# Patient Record
Sex: Male | Born: 1975 | Race: Black or African American | Hispanic: No | Marital: Married | Smoking: Current every day smoker
Health system: Southern US, Community
[De-identification: ages and names within clinical notes are randomized; demographics above are authoritative.]

## PROBLEM LIST (undated history)

## (undated) DIAGNOSIS — I10 Essential (primary) hypertension: Secondary | ICD-10-CM

---

## 2003-09-24 ENCOUNTER — Emergency Department (HOSPITAL_COMMUNITY): Admission: EM | Admit: 2003-09-24 | Discharge: 2003-09-24 | Payer: Self-pay | Admitting: Emergency Medicine

## 2004-04-17 ENCOUNTER — Emergency Department (HOSPITAL_COMMUNITY): Admission: EM | Admit: 2004-04-17 | Discharge: 2004-04-17 | Payer: Self-pay | Admitting: Emergency Medicine

## 2008-08-25 ENCOUNTER — Emergency Department (HOSPITAL_COMMUNITY): Admission: EM | Admit: 2008-08-25 | Discharge: 2008-08-25 | Payer: Self-pay | Admitting: Emergency Medicine

## 2009-09-30 ENCOUNTER — Emergency Department (HOSPITAL_COMMUNITY): Admission: EM | Admit: 2009-09-30 | Discharge: 2009-09-30 | Payer: Self-pay | Admitting: Emergency Medicine

## 2011-07-12 LAB — GLUCOSE, CAPILLARY: Glucose-Capillary: 103 — ABNORMAL HIGH

## 2011-07-17 ENCOUNTER — Emergency Department (HOSPITAL_COMMUNITY): Payer: Self-pay

## 2011-07-17 ENCOUNTER — Emergency Department (HOSPITAL_COMMUNITY)
Admission: EM | Admit: 2011-07-17 | Discharge: 2011-07-17 | Disposition: A | Discharge status: Home / Self Care | Payer: Self-pay | Attending: Emergency Medicine | Admitting: Emergency Medicine

## 2011-07-17 DIAGNOSIS — R079 Chest pain, unspecified: Secondary | ICD-10-CM | POA: Insufficient documentation

## 2011-07-17 DIAGNOSIS — R112 Nausea with vomiting, unspecified: Secondary | ICD-10-CM | POA: Insufficient documentation

## 2011-07-17 DIAGNOSIS — B9789 Other viral agents as the cause of diseases classified elsewhere: Secondary | ICD-10-CM | POA: Insufficient documentation

## 2011-07-17 DIAGNOSIS — K219 Gastro-esophageal reflux disease without esophagitis: Secondary | ICD-10-CM | POA: Insufficient documentation

## 2011-07-17 DIAGNOSIS — R197 Diarrhea, unspecified: Secondary | ICD-10-CM | POA: Insufficient documentation

## 2011-07-17 DIAGNOSIS — R51 Headache: Secondary | ICD-10-CM | POA: Insufficient documentation

## 2011-07-17 LAB — CBC
HCT: 44.2 % (ref 39.0–52.0)
Hemoglobin: 15.4 g/dL (ref 13.0–17.0)
MCH: 29.8 pg (ref 26.0–34.0)
MCHC: 34.8 g/dL (ref 30.0–36.0)
MCV: 85.7 fL (ref 78.0–100.0)
Platelets: 200 10*3/uL (ref 150–400)
RBC: 5.16 MIL/uL (ref 4.22–5.81)
RDW: 15 % (ref 11.5–15.5)
WBC: 11.7 10*3/uL — ABNORMAL HIGH (ref 4.0–10.5)

## 2011-07-17 LAB — DIFFERENTIAL
Basophils Absolute: 0 10*3/uL (ref 0.0–0.1)
Basophils Relative: 0 % (ref 0–1)
Eosinophils Absolute: 0.3 10*3/uL (ref 0.0–0.7)
Eosinophils Relative: 3 % (ref 0–5)
Lymphocytes Relative: 25 % (ref 12–46)
Lymphs Abs: 2.9 10*3/uL (ref 0.7–4.0)
Monocytes Absolute: 1.3 10*3/uL — ABNORMAL HIGH (ref 0.1–1.0)
Monocytes Relative: 11 % (ref 3–12)
Neutro Abs: 7.2 10*3/uL (ref 1.7–7.7)
Neutrophils Relative %: 61 % (ref 43–77)

## 2011-07-17 LAB — POCT I-STAT, CHEM 8
BUN: 20 mg/dL (ref 6–23)
Calcium, Ion: 1.17 mmol/L (ref 1.12–1.32)
Chloride: 105 mEq/L (ref 96–112)
Creatinine, Ser: 1.4 mg/dL — ABNORMAL HIGH (ref 0.50–1.35)
Glucose, Bld: 90 mg/dL (ref 70–99)
HCT: 49 % (ref 39.0–52.0)
Hemoglobin: 16.7 g/dL (ref 13.0–17.0)
Potassium: 3.9 mEq/L (ref 3.5–5.1)
Sodium: 139 mEq/L (ref 135–145)
TCO2: 22 mmol/L (ref 0–100)

## 2011-07-17 LAB — COMPREHENSIVE METABOLIC PANEL
ALT: 27 U/L (ref 0–53)
AST: 26 U/L (ref 0–37)
Albumin: 3.7 g/dL (ref 3.5–5.2)
Alkaline Phosphatase: 68 U/L (ref 39–117)
BUN: 19 mg/dL (ref 6–23)
CO2: 25 mEq/L (ref 19–32)
Calcium: 8.9 mg/dL (ref 8.4–10.5)
Chloride: 103 mEq/L (ref 96–112)
Creatinine, Ser: 1.22 mg/dL (ref 0.50–1.35)
GFR calc Af Amer: 88 mL/min — ABNORMAL LOW (ref >90)
GFR calc non Af Amer: 76 mL/min — ABNORMAL LOW (ref >90)
Glucose, Bld: 94 mg/dL (ref 70–99)
Potassium: 3.9 mEq/L (ref 3.5–5.1)
Sodium: 138 mEq/L (ref 135–145)
Total Bilirubin: 0.2 mg/dL — ABNORMAL LOW (ref 0.3–1.2)
Total Protein: 6.6 g/dL (ref 6.0–8.3)

## 2011-07-17 LAB — LIPASE, BLOOD: Lipase: 33 U/L (ref 11–59)

## 2012-05-10 IMAGING — CR DG CHEST 2V
2 series · 2 of 2 positions shown · non-contrast
Comparison: 09/30/2009

CLINICAL DATA: Cough, congestion.

CHEST - 2 VIEW

[w chest pa]
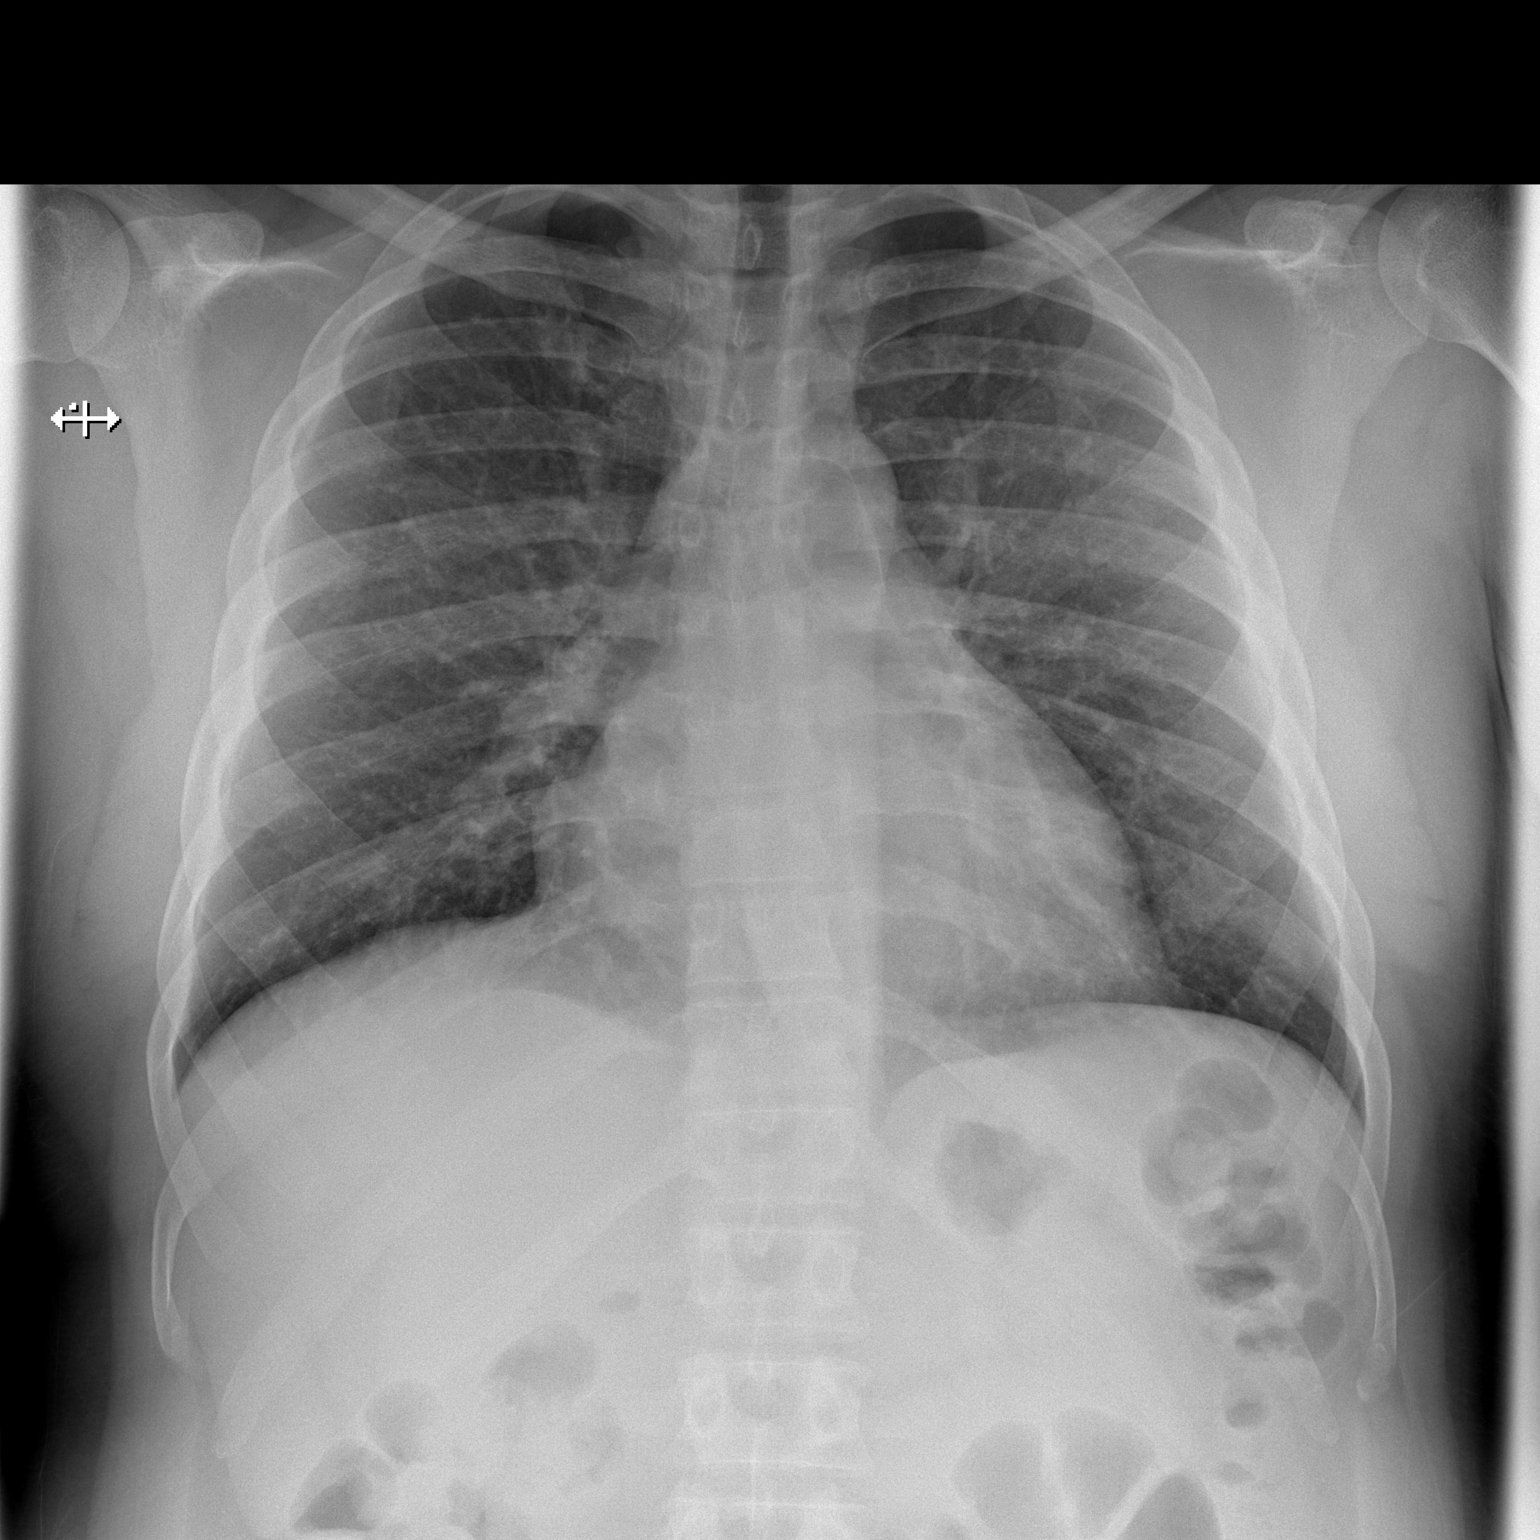

[w chest lat]
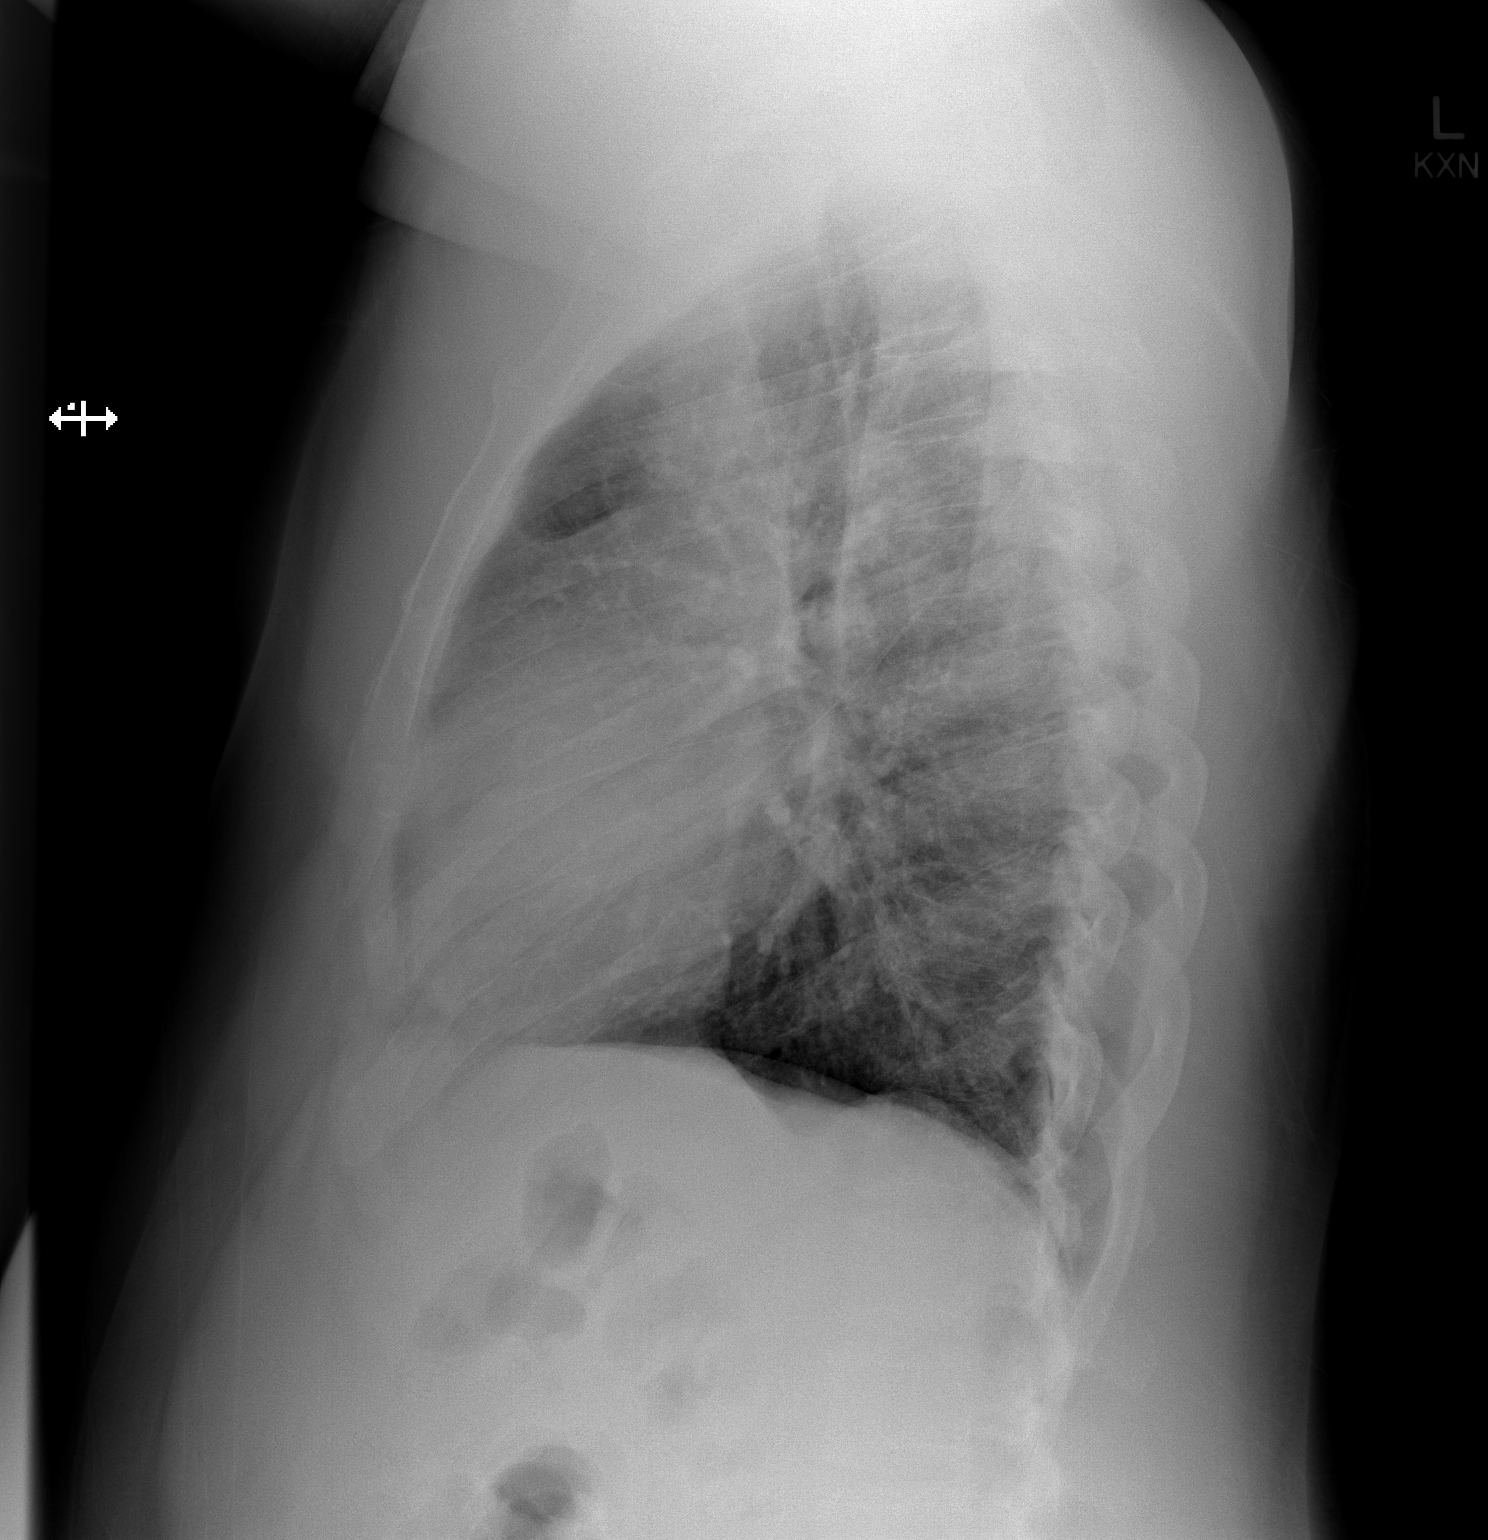

[2 of 2 positions shown; findings below may reference images not displayed]

FINDINGS: Interstitial prominence without focal consolidation.  No
pleural effusion or pneumothorax.  Cardiomediastinal contours are
unchanged, upper normal limits to mildly enlarged.  No acute
osseous abnormality.
IMPRESSION: Mild interstitial prominence without focal consolidation.

## 2012-12-22 ENCOUNTER — Encounter (HOSPITAL_COMMUNITY): Payer: Self-pay | Admitting: Nurse Practitioner

## 2012-12-22 ENCOUNTER — Emergency Department (HOSPITAL_COMMUNITY)
Admission: EM | Admit: 2012-12-22 | Discharge: 2012-12-22 | Disposition: A | Discharge status: Home / Self Care | Payer: Self-pay | Attending: Emergency Medicine | Admitting: Emergency Medicine

## 2012-12-22 DIAGNOSIS — A084 Viral intestinal infection, unspecified: Secondary | ICD-10-CM

## 2012-12-22 DIAGNOSIS — A088 Other specified intestinal infections: Secondary | ICD-10-CM | POA: Insufficient documentation

## 2012-12-22 DIAGNOSIS — R112 Nausea with vomiting, unspecified: Secondary | ICD-10-CM | POA: Insufficient documentation

## 2012-12-22 DIAGNOSIS — R197 Diarrhea, unspecified: Secondary | ICD-10-CM | POA: Insufficient documentation

## 2012-12-22 DIAGNOSIS — F17200 Nicotine dependence, unspecified, uncomplicated: Secondary | ICD-10-CM | POA: Insufficient documentation

## 2012-12-22 DIAGNOSIS — R1084 Generalized abdominal pain: Secondary | ICD-10-CM | POA: Insufficient documentation

## 2012-12-22 LAB — COMPREHENSIVE METABOLIC PANEL
ALT: 27 U/L (ref 0–53)
AST: 26 U/L (ref 0–37)
Albumin: 4.1 g/dL (ref 3.5–5.2)
Alkaline Phosphatase: 82 U/L (ref 39–117)
BUN: 11 mg/dL (ref 6–23)
CO2: 25 mEq/L (ref 19–32)
Calcium: 9.1 mg/dL (ref 8.4–10.5)
Chloride: 103 mEq/L (ref 96–112)
Creatinine, Ser: 1.22 mg/dL (ref 0.50–1.35)
GFR calc Af Amer: 87 mL/min — ABNORMAL LOW (ref >90)
GFR calc non Af Amer: 75 mL/min — ABNORMAL LOW (ref >90)
Glucose, Bld: 93 mg/dL (ref 70–99)
Potassium: 3.7 mEq/L (ref 3.5–5.1)
Sodium: 137 mEq/L (ref 135–145)
Total Bilirubin: 0.2 mg/dL — ABNORMAL LOW (ref 0.3–1.2)
Total Protein: 7.3 g/dL (ref 6.0–8.3)

## 2012-12-22 LAB — URINALYSIS, MICROSCOPIC ONLY
Bilirubin Urine: NEGATIVE
Glucose, UA: NEGATIVE mg/dL
Hgb urine dipstick: NEGATIVE
Ketones, ur: NEGATIVE mg/dL
Leukocytes, UA: NEGATIVE
Nitrite: NEGATIVE
Protein, ur: NEGATIVE mg/dL
Specific Gravity, Urine: 1.027 (ref 1.005–1.030)
Urobilinogen, UA: 1 mg/dL (ref 0.0–1.0)
pH: 5.5 (ref 5.0–8.0)

## 2012-12-22 LAB — LIPASE, BLOOD: Lipase: 25 U/L (ref 11–59)

## 2012-12-22 MED ORDER — SODIUM CHLORIDE 0.9 % IV BOLUS (SEPSIS)
1000.0000 mL | Freq: Once | INTRAVENOUS | Status: AC
  Administered 2012-12-22: 1000 mL via INTRAVENOUS

## 2012-12-22 MED ORDER — ONDANSETRON HCL 4 MG/2ML IJ SOLN
4.0000 mg | Freq: Once | INTRAMUSCULAR | Status: AC
  Administered 2012-12-22: 4 mg via INTRAVENOUS
  Filled 2012-12-22: qty 2

## 2012-12-22 MED ORDER — ONDANSETRON HCL 4 MG PO TABS: 4.0000 mg | ORAL_TABLET | Freq: Four times a day (QID) | ORAL | Status: DC

## 2012-12-22 MED ORDER — MORPHINE SULFATE 4 MG/ML IJ SOLN
2.0000 mg | Freq: Once | INTRAMUSCULAR | Status: AC
  Administered 2012-12-22: 2 mg via INTRAVENOUS
  Filled 2012-12-22: qty 1

## 2012-12-22 NOTE — ED Provider Notes (Signed)
History     CSN: [id number]  Arrival date & time 12/22/12  1116   First MD Initiated Contact with Patient 12/22/12 1214      Chief Complaint  Patient presents with  . Diarrhea    (Consider location/radiation/quality/duration/timing/severity/associated sxs/prior treatment) HPI Comments: Patient presents for nausea vomiting and diarrhea since yesterday. Patient states he has had 5 episodes of nonbloody nonbilious emesis since yesterday and approximately 6 episodes of nonbloody, watery diarrhea since yesterday. Patient denies aggravating or alleviating factors of his vomiting and diarrhea. Patient states he has been able to tolerate by mouth fluids, but has had difficulty keeping food down. Patient admits to generalized abdominal cramping and discomfort. Patient denies fevers, difficulty swallowing, chest pain, shortness of breath, hematuria, dysuria, lower extremity numbness or weakness, and denies sick contacts.  Patient is a 37 y.o. male presenting with diarrhea. The history is provided by the patient. No language interpreter was used.  Diarrhea Associated symptoms: vomiting   Associated symptoms: no abdominal pain, no chills and no fever     History reviewed. No pertinent past medical history.  History reviewed. No pertinent past surgical history.  History reviewed. No pertinent family history.  History  Substance Use Topics  . Smoking status: Current Every Day Smoker  . Smokeless tobacco: Not on file  . Alcohol Use: Yes     Review of Systems  Constitutional: Negative for fever and chills.  HENT: Negative for trouble swallowing and neck pain.   Eyes: Negative for visual disturbance.  Respiratory: Negative for chest tightness and shortness of breath.   Cardiovascular: Negative for chest pain.  Gastrointestinal: Positive for nausea, vomiting and diarrhea. Negative for abdominal pain and blood in stool.  Genitourinary: Negative for dysuria and hematuria.  Musculoskeletal:  Negative for back pain.  Skin: Negative for color change.  Neurological: Negative for syncope, weakness and numbness.  All other systems reviewed and are negative.    Allergies  Review of patient's allergies indicates no known allergies.  Home Medications   Current Outpatient Rx  Name  Route  Sig  Dispense  Refill  . ondansetron (ZOFRAN) 4 MG tablet   Oral   Take 1 tablet (4 mg total) by mouth every 6 (six) hours.   12 tablet   0     BP 123/73  Pulse 87  Temp(Src) 98.3 F (36.8 C) (Oral)  Resp 16  SpO2 98%  Physical Exam  Nursing note and vitals reviewed. Constitutional: He is oriented to person, place, and time. He appears well-developed and well-nourished. No distress.  HENT:  Head: Normocephalic and atraumatic.  Mouth/Throat: Oropharynx is clear and moist. No oropharyngeal exudate.  Eyes: Conjunctivae are normal. Pupils are equal, round, and reactive to light. No scleral icterus.  Neck: Normal range of motion. Neck supple.  Cardiovascular: Normal rate, regular rhythm, normal heart sounds and intact distal pulses.   No murmur heard. Pulmonary/Chest: Effort normal and breath sounds normal. No respiratory distress. He has no wheezes. He has no rales.  Abdominal: Soft. Bowel sounds are normal. He exhibits no distension and no mass. There is no tenderness. There is no rebound and no guarding.  Musculoskeletal: Normal range of motion. He exhibits no edema.  Neurological: He is alert and oriented to person, place, and time.  Skin: Skin is warm and dry. He is not diaphoretic.  Psychiatric: He has a normal mood and affect. His behavior is normal.    ED Course  Procedures (including critical care time)  Labs Reviewed  COMPREHENSIVE METABOLIC PANEL - Abnormal; Notable for the following:    Total Bilirubin 0.2 (*)    GFR calc non Af Amer 75 (*)    GFR calc Af Amer 87 (*)    All other components within normal limits  LIPASE, BLOOD  URINALYSIS, MICROSCOPIC ONLY   No  results found.   1. Viral gastroenteritis   2. Nausea and vomiting   3. Diarrhea      MDM  Patient presents for nausea vomiting and diarrhea since yesterday. Workup today to include CMP, lipase, and urinalysis as well as IV fluid hydration and IV Zofran. Suspect viral gastroenteritis. Patient nontender on palpation of the abdomen with no peritoneal signs.  Patient's lab work unremarkable. Patient states symptoms are improving and he is requesting to eat and drink. PO fluids given.  Patient tolerating by mouth fluids. Patient has remained stable, well appearing and nontoxic, and he is in no acute distress.  Patient will be discharged with PCP followup for symptoms and given Zofran to take as needed for nausea and vomiting; resource guide provided. Patient states comfort and understanding with this d/c plan. Patient work up and management discussed with Dr. Manus Gunning prior to ED discharge.  Filed Vitals:   12/22/12 1130  BP: 123/73  Pulse: 87  Temp: 98.3 F (36.8 C)  TempSrc: Oral  Resp: 16  SpO2: 98%         Antony Madura, PA-C 12/22/12 1530

## 2012-12-22 NOTE — ED Provider Notes (Signed)
Medical screening examination/treatment/procedure(s) were performed by non-physician practitioner and as supervising physician I was immediately available for consultation/collaboration.   Melora Menon, MD 12/22/12 1653 

## 2012-12-22 NOTE — ED Notes (Signed)
Pt reports diarrhea onset yesterday am then began to vomit last night. Reports symptoms have persisted since onset and he is unable to tolerate any oral intake.

## 2017-01-02 ENCOUNTER — Encounter (HOSPITAL_COMMUNITY): Payer: Self-pay

## 2017-01-02 ENCOUNTER — Emergency Department (HOSPITAL_COMMUNITY)
Admission: EM | Admit: 2017-01-02 | Discharge: 2017-01-02 | Disposition: A | Discharge status: Home / Self Care | Payer: Self-pay | Attending: Emergency Medicine | Admitting: Emergency Medicine

## 2017-01-02 DIAGNOSIS — K0889 Other specified disorders of teeth and supporting structures: Secondary | ICD-10-CM | POA: Insufficient documentation

## 2017-01-02 DIAGNOSIS — F17200 Nicotine dependence, unspecified, uncomplicated: Secondary | ICD-10-CM | POA: Insufficient documentation

## 2017-01-02 MED ORDER — IBUPROFEN 600 MG PO TABS: 600.0000 mg | ORAL_TABLET | Freq: Four times a day (QID) | ORAL | 0 refills | Status: DC | PRN

## 2017-01-02 MED ORDER — IBUPROFEN 400 MG PO TABS
600.0000 mg | ORAL_TABLET | Freq: Once | ORAL | Status: AC
  Administered 2017-01-02: 600 mg via ORAL
  Filled 2017-01-02: qty 1

## 2017-01-02 MED ORDER — PENICILLIN V POTASSIUM 500 MG PO TABS: 500.0000 mg | ORAL_TABLET | Freq: Four times a day (QID) | ORAL | 0 refills | Status: AC

## 2017-01-02 MED ORDER — PENICILLIN V POTASSIUM 250 MG PO TABS
500.0000 mg | ORAL_TABLET | Freq: Once | ORAL | Status: AC
  Administered 2017-01-02: 500 mg via ORAL
  Filled 2017-01-02: qty 2

## 2017-01-02 NOTE — Discharge Instructions (Signed)
Ibuprofen for pain. Penicillin as prescribed until all gone. Follow up with a dentist.

## 2017-01-02 NOTE — ED Notes (Signed)
Declined W/C at D/C and was escorted to lobby by RN. 

## 2017-01-02 NOTE — ED Triage Notes (Signed)
Pt reports dental pain for three days, top right. He states he thinks it is wisdom tooth that is coming in.

## 2017-01-02 NOTE — ED Provider Notes (Signed)
[facility]-EMERGENCY DEPT Provider Note   CSN: [id number] Arrival date & time: 01/02/17  1218  By signing my name below, I, Warren Rosales, attest that this documentation has been prepared under the direction and in the presence of [name], PA-C. Electronically Signed: Marnette Burgess Rosales, Scribe. 01/02/2017. 2:37 PM.  History   Chief Complaint Chief Complaint  Patient presents with  . Dental Pain   The history is provided by the patient and medical records. No language interpreter was used.    HPI Comments:  Warren Rosales is a 41 y.o. male with no pertinent PMHx, who presents to the Emergency Department complaining of constant, gradually worsening, throbbing upper right dental pain onset three days ago. Pt reports the throbbing pain arising three days ago and progressively worsening since onset. He states he thinks his wisdom tooth is coming in as he is not sure if he has his wisdom teeth yet. He tried ASA at home with no relief of his pain. Direct palpation exacerbates his pain. Pt denies any other complaints at this time. Pt is a current every day smoker and does not currently have a Education officer, community.    History reviewed. No pertinent past medical history.  There are no active problems to display for this patient.  History reviewed. No pertinent surgical history.  Home Medications    Prior to Admission medications   Medication Sig Start Date End Date Taking? Authorizing Provider  ondansetron (ZOFRAN) 4 MG tablet Take 1 tablet (4 mg total) by mouth every 6 (six) hours. 12/22/12   Antony Madura, PA-C    Family History No family history on file.  Social History Social History  Substance Use Topics  . Smoking status: Current Every Day Smoker  . Smokeless tobacco: Never Used  . Alcohol use Yes    Allergies   Patient has no known allergies.   Review of Systems Review of Systems  HENT: Positive for dental problem.      Physical Exam Updated Vital Signs BP (!) 141/78 (BP  Location: Left Arm)   Pulse 76   Temp 98.6 F (37 C) (Oral)   Resp 16   SpO2 97%   Physical Exam  Constitutional: He is oriented to person, place, and time. He appears well-developed and well-nourished.  HENT:  Head: Normocephalic.  Tenderness to palpation over right lower third molar. No obvious facial swelling. No mouth lesions. No trismus. No swelling under the tongue. No obvious tooth infection or caries.  Eyes: Conjunctivae are normal.  Cardiovascular: Normal rate.   Pulmonary/Chest: Effort normal.  Abdominal: He exhibits no distension.  Musculoskeletal: Normal range of motion.  Neurological: He is alert and oriented to person, place, and time.  Skin: Skin is warm and dry.  Psychiatric: He has a normal mood and affect.  Nursing note and vitals reviewed.    ED Treatments / Results  DIAGNOSTIC STUDIES:  Oxygen Saturation is 97% on RA, normal by my interpretation.    COORDINATION OF CARE:  2:36 PM Discussed treatment plan with pt at bedside including Abx and Ibuprofen and pt agreed to plan.  Labs (all labs ordered are listed, but only abnormal results are displayed) Labs Reviewed - No data to display  EKG  EKG Interpretation None       Radiology No results found.  Procedures Procedures (including critical care time)  Medications Ordered in ED Medications - No data to display   Initial Impression / Assessment and Plan / ED Course  I have reviewed the triage  vital signs and the nursing notes.  Pertinent labs & imaging results that were available during my care of the patient were reviewed by me and considered in my medical decision making (see chart for details).     Patient emergency department with dental pain. Patient believes his wisdom tooth is coming in. I do not see any signs of that. I do not see any obvious infectious process. There is no trismus. No swelling under the tongue. I offered patient to get an x-ray, however he refused stating he does  not have enough funds. Will start on penicillin for possible infectious process. Pain management with NSAIDs. Follow-up with a dentist.  Vitals:   01/02/17 1234 01/02/17 1421 01/02/17 1455  BP: (!) 149/92 (!) 141/78 134/83  Pulse: 82 76 81  Resp: 18 16 20   Temp: 98.6 F (37 C)  98.7 F (37.1 C)  TempSrc: Oral  Oral  SpO2: 98% 97% 98%     Final Clinical Impressions(s) / ED Diagnoses   Final diagnoses:  Pain, dental    New Prescriptions Discharge Medication List as of 01/02/2017  2:47 PM    START taking these medications   Details  ibuprofen (ADVIL,MOTRIN) 600 MG tablet Take 1 tablet (600 mg total) by mouth every 6 (six) hours as needed., Starting Mon 01/02/2017, Print    penicillin v potassium (VEETID) 500 MG tablet Take 1 tablet (500 mg total) by mouth 4 (four) times daily., Starting Mon 01/02/2017, Until Mon 01/09/2017, Print       I personally performed the services described in this documentation, which was scribed in my presence. The recorded information has been reviewed and is accurate.     [name], PA-C 01/02/17 1701    [name], MD 01/02/17 2136

## 2017-12-13 ENCOUNTER — Emergency Department (HOSPITAL_COMMUNITY)
Admission: EM | Admit: 2017-12-13 | Discharge: 2017-12-13 | Disposition: A | Discharge status: Home / Self Care | Payer: Self-pay | Attending: Emergency Medicine | Admitting: Emergency Medicine

## 2017-12-13 ENCOUNTER — Other Ambulatory Visit: Payer: Self-pay

## 2017-12-13 ENCOUNTER — Encounter (HOSPITAL_COMMUNITY): Payer: Self-pay | Admitting: Emergency Medicine

## 2017-12-13 DIAGNOSIS — K0889 Other specified disorders of teeth and supporting structures: Secondary | ICD-10-CM | POA: Insufficient documentation

## 2017-12-13 DIAGNOSIS — F17200 Nicotine dependence, unspecified, uncomplicated: Secondary | ICD-10-CM | POA: Insufficient documentation

## 2017-12-13 MED ORDER — AMOXICILLIN 500 MG PO CAPS
1000.0000 mg | ORAL_CAPSULE | Freq: Once | ORAL | Status: AC
  Administered 2017-12-13: 1000 mg via ORAL
  Filled 2017-12-13: qty 2

## 2017-12-13 MED ORDER — AMOXICILLIN 500 MG PO CAPS: 1000.0000 mg | ORAL_CAPSULE | Freq: Two times a day (BID) | ORAL | 0 refills | Status: AC

## 2017-12-13 MED ORDER — HYDROCODONE-ACETAMINOPHEN 5-325 MG PO TABS: ORAL_TABLET | ORAL | 0 refills | Status: AC

## 2017-12-13 MED ORDER — BUPIVACAINE-EPINEPHRINE (PF) 0.5% -1:200000 IJ SOLN
1.8000 mL | Freq: Once | INTRAMUSCULAR | Status: AC
  Administered 2017-12-13: 1.8 mL
  Filled 2017-12-13: qty 1.8

## 2017-12-13 NOTE — ED Triage Notes (Signed)
Pt c/o 9/10 right side dental pain for the past few days not getting better with ODT medications, pt states is radiating to his right eye. No fever or chills.

## 2017-12-13 NOTE — Discharge Instructions (Signed)
Take vicodin for breakthrough pain, do not drink alcohol, drive, care for children or do other critical tasks while taking vicodin. °  °Apply warm compresses to jaw throughout the day.  ° °Take your antibiotics as directed and to the end of the course.  ° °Followup with a dentist is very important for dental pain. Return to emergency department for emergent changing or worsening symptoms including  fever, change in vision, redness to the face that rapidly spreads towards the eye, nausea or vomiting, difficulty swallowing or shortness of breath. ° °

## 2017-12-13 NOTE — ED Provider Notes (Signed)
[facility] EMERGENCY DEPARTMENT Provider Note   CSN: [id number] Arrival date & time: 12/13/17  2149     History   Chief Complaint Chief Complaint  Patient presents with  . Dental Pain    HPI   Blood pressure (!) 165/98, pulse 84, temperature 98.8 F (37.1 C), temperature source Oral, resp. rate (!) 22, height 5' 9.5" (1.765 m), weight 90.7 kg (200 lb), SpO2 94 %.  Warren Rosales is a 42 y.o. male complaining of right lower dental pain worsening over the course of the last several days.  He states the pain is severe, is causing him to be nauseous, he states the pain radiates up the face to the eye.  He is tried multiple over-the-counter pain medications with little relief. Denies fever/chills, difficulty opening jaw, difficulty swallowing, SOB, gum swelling, facial swelling, neck swelling.     History reviewed. No pertinent past medical history.  There are no active problems to display for this patient.   History reviewed. No pertinent surgical history.     Home Medications    Prior to Admission medications   Medication Sig Start Date End Date Taking? Authorizing Provider  amoxicillin (AMOXIL) 500 MG capsule Take 2 capsules (1,000 mg total) by mouth 2 (two) times daily. 12/13/17   Renetta Suman, Joni Reining, PA-C  HYDROcodone-acetaminophen (NORCO/VICODIN) 5-325 MG tablet Take 1-2 tablets by mouth every 6 hours as needed for pain. 12/13/17   Anjolina Byrer, Joni Reining, PA-C  ibuprofen (ADVIL,MOTRIN) 600 MG tablet Take 1 tablet (600 mg total) by mouth every 6 (six) hours as needed. 01/02/17   Kirichenko, Tatyana, PA-C  ondansetron (ZOFRAN) 4 MG tablet Take 1 tablet (4 mg total) by mouth every 6 (six) hours. 12/22/12   Antony Madura, PA-C    Family History No family history on file.  Social History Social History   Tobacco Use  . Smoking status: Current Every Day Smoker  . Smokeless tobacco: Never Used  Substance Use Topics  . Alcohol use: Yes  . Drug use: No      Allergies   Patient has no known allergies.   Review of Systems Review of Systems A complete review of systems was obtained and all systems are negative except as noted in the HPI and PMH.    Physical Exam Updated Vital Signs BP (!) 165/98 (BP Location: Right Arm)   Pulse 84   Temp 98.8 F (37.1 C) (Oral)   Resp (!) 22   Ht 5' 9.5" (1.765 m)   Wt 90.7 kg (200 lb)   SpO2 94%   BMI 29.11 kg/m   Physical Exam  Constitutional: He is oriented to person, place, and time. He appears well-developed and well-nourished. No distress.  HENT:  Head: Normocephalic.  Mouth/Throat: Oropharynx is clear and moist.  Generally good dentition, no gingival swelling, erythema or tenderness to palpation. Patient is handling their secretions. There is no tenderness to palpation or firmness underneath tongue bilaterally. No trismus.    Eyes: Conjunctivae and EOM are normal. Pupils are equal, round, and reactive to light.  Neck: Normal range of motion.  Cardiovascular: Normal rate.  Pulmonary/Chest: Effort normal. No stridor.  Abdominal: Soft.  Musculoskeletal: Normal range of motion.  Lymphadenopathy:    He has no cervical adenopathy.  Neurological: He is alert and oriented to person, place, and time.  Psychiatric: He has a normal mood and affect.  Nursing note and vitals reviewed.    ED Treatments / Results  Labs (all labs ordered are listed, but only  abnormal results are displayed) Labs Reviewed - No data to display  EKG  EKG Interpretation None       Radiology No results found.  Procedures Dental Block Date/Time: 12/13/2017 10:57 PM Performed by: Wynetta Emery, PA-C Authorized by: Wynetta Emery, PA-C   Consent:    Consent obtained:  Verbal   Consent given by:  Patient Indications:    Indications: dental pain   Location:    Block type:  Inferior alveolar   Laterality:  Right Procedure details (see MAR for exact dosages):    Needle gauge:  27 G    Anesthetic injected:  Bupivacaine 0.5% WITH epi   Injection procedure:  Anatomic landmarks identified Post-procedure details:    Outcome:  Pain relieved   Patient tolerance of procedure:  Tolerated well, no immediate complications Dental Block Date/Time: 12/13/2017 10:57 PM Performed by: Wynetta Emery, PA-C Authorized by: Wynetta Emery, PA-C   Consent:    Consent obtained:  Verbal   Consent given by:  Patient Indications:    Indications: dental pain   Location:    Block type:  Posterior superior alveolar   Laterality:  Left Procedure details (see MAR for exact dosages):    Needle gauge:  27 G   Anesthetic injected:  Bupivacaine 0.5% WITH epi   Injection procedure:  Anatomic landmarks identified Post-procedure details:    Outcome:  Pain relieved   Patient tolerance of procedure:  Tolerated well, no immediate complications   (including critical care time)  Medications Ordered in ED Medications  bupivacaine-epinephrine (MARCAINE W/ EPI) 0.5% -1:200000 injection 1.8 mL (not administered)  bupivacaine-epinephrine (MARCAINE W/ EPI) 0.5% -1:200000 injection 1.8 mL (not administered)  amoxicillin (AMOXIL) capsule 1,000 mg (not administered)     Initial Impression / Assessment and Plan / ED Course  I have reviewed the triage vital signs and the nursing notes.  Pertinent labs & imaging results that were available during my care of the patient were reviewed by me and considered in my medical decision making (see chart for details).     Vitals:   12/13/17 2159  BP: (!) 165/98  Pulse: 84  Resp: (!) 22  Temp: 98.8 F (37.1 C)  TempSrc: Oral  SpO2: 94%  Weight: 90.7 kg (200 lb)  Height: 5' 9.5" (1.765 m)    Medications  bupivacaine-epinephrine (MARCAINE W/ EPI) 0.5% -1:200000 injection 1.8 mL (not administered)  bupivacaine-epinephrine (MARCAINE W/ EPI) 0.5% -1:200000 injection 1.8 mL (not administered)  amoxicillin (AMOXIL) capsule 1,000 mg (not administered)     Warren Rosales is 42 y.o. male presenting with dental pain associated with dental caries but no signs or symptoms of dental abscess. Patient afebrile, non toxic appearing and swallowing secretions well. I gave patient referral to dentist and stressed the importance of dental follow up for definitive management of dental issues. Patient voices understanding and is agreeable to plan.  Evaluation does not show pathology that would require ongoing emergent intervention or inpatient treatment. Pt is hemodynamically stable and mentating appropriately. Discussed findings and plan with patient/guardian, who agrees with care plan. All questions answered. Return precautions discussed and outpatient follow up given.      Final Clinical Impressions(s) / ED Diagnoses   Final diagnoses:  Pain, dental    ED Discharge Orders        Ordered    amoxicillin (AMOXIL) 500 MG capsule  2 times daily     12/13/17 2254    HYDROcodone-acetaminophen (NORCO/VICODIN) 5-325 MG tablet     12/13/17 2254  Kaylyn Limisciotta, Anzel Kearse, PA-C 12/13/17 2258    Loren RacerYelverton, David, MD 12/18/17 [phone]

## 2020-06-02 ENCOUNTER — Ambulatory Visit: Payer: Self-pay | Attending: Internal Medicine

## 2020-06-02 DIAGNOSIS — Z23 Encounter for immunization: Secondary | ICD-10-CM

## 2020-06-02 NOTE — Progress Notes (Signed)
   Covid-19 Vaccination Clinic  Name:  Warren Rosales    MRN: [id number] DOB: 09-Jun-1976  06/02/2020  Mr. [name] was observed post Covid-19 immunization for 15 minutes without incident. He was provided with Vaccine Information Sheet and instruction to access the V-Safe system.   Mr. [name] was instructed to call 911 with any severe reactions post vaccine: Marland Kitchen Difficulty breathing  . Swelling of face and throat  . A fast heartbeat  . A bad rash all over body  . Dizziness and weakness   Immunizations Administered    Name Date Dose VIS Date Route   Pfizer COVID-19 Vaccine 06/02/2020  1:19 PM 0.3 mL 12/04/2018 Intramuscular   Manufacturer: [employer], [employer]   Lot: [id number]   NDC: [address]-8

## 2020-06-19 ENCOUNTER — Encounter (HOSPITAL_COMMUNITY): Payer: Self-pay

## 2020-06-19 ENCOUNTER — Ambulatory Visit (HOSPITAL_COMMUNITY)
Admission: EM | Admit: 2020-06-19 | Discharge: 2020-06-19 | Disposition: A | Discharge status: Home / Self Care | Payer: HRSA Program | Attending: Urgent Care | Admitting: Urgent Care

## 2020-06-19 ENCOUNTER — Other Ambulatory Visit: Payer: Self-pay

## 2020-06-19 DIAGNOSIS — B349 Viral infection, unspecified: Secondary | ICD-10-CM | POA: Insufficient documentation

## 2020-06-19 DIAGNOSIS — Z20822 Contact with and (suspected) exposure to covid-19: Secondary | ICD-10-CM

## 2020-06-19 MED ORDER — ONDANSETRON HCL 4 MG PO TABS: 4.0000 mg | ORAL_TABLET | Freq: Three times a day (TID) | ORAL | 0 refills | Status: AC | PRN

## 2020-06-19 MED ORDER — GUAIFENESIN-DM 100-10 MG/5ML PO SYRP: 5.0000 mL | ORAL_SOLUTION | ORAL | 0 refills | Status: DC | PRN

## 2020-06-19 MED ORDER — FLUTICASONE PROPIONATE 50 MCG/ACT NA SUSP: 1.0000 | Freq: Every day | NASAL | 2 refills | Status: AC

## 2020-06-19 NOTE — ED Triage Notes (Signed)
Patient here for covid test after he had nausea and fatigue at work 3 days ago. Pt has had his first vaccine, work wanted him to be tested before returning. Pt does not feel poorly at this time.

## 2020-06-19 NOTE — ED Provider Notes (Signed)
[facility]    CSN: [id number] Arrival date & time: 06/19/20  1330      History   Chief Complaint Chief Complaint  Patient presents with   Covid Exposure    HPI Warren Rosales is a 44 y.o. male.   Patient reports urgent care for Covid testing due to nausea, fatigue, cough and runny nose.  Symptoms started 3 days ago.  He reports he had to leave work and his work is requiring Covid testing.  He reports feeling fatigued and some nausea that is coming gone.  He reports he did vomit today after food, or states he is not nauseous anymore.  Feels hungry and wants to try to eat.  Slight cough occasionally.  Endorses sneezing and runny nose.  Denies fever or chills.  No sore throat.  Denies abdominal pain.  Denies diarrhea.  Received 1 Covid shot.  Unclear of Covid exposures.  States he is feeling much better now. Does endorse little more frequency of urination.  Reports this has been the case since he received his first Covid vaccination.  I told him to drink plenty water.  He is drinking about 1 bottle of water every hour.  Patient declines urinalysis.  Denies a history of allergies to sugar.      History reviewed. No pertinent past medical history.  There are no problems to display for this patient.   History reviewed. No pertinent surgical history.     Home Medications    Prior to Admission medications   Medication Sig Start Date End Date Taking? Authorizing Provider  amoxicillin (AMOXIL) 500 MG capsule Take 2 capsules (1,000 mg total) by mouth 2 (two) times daily. 12/13/17   Pisciotta, Joni Reining, PA-C  fluticasone (FLONASE) 50 MCG/ACT nasal spray Place 1 spray into both nostrils daily. 06/19/20   Roshell Brigham, Veryl Speak, PA-C  guaiFENesin-dextromethorphan (ROBITUSSIN DM) 100-10 MG/5ML syrup Take 5 mLs by mouth every 4 (four) hours as needed for cough. 06/19/20   Yonah Tangeman, Veryl Speak, PA-C  HYDROcodone-acetaminophen (NORCO/VICODIN) 5-325 MG tablet Take 1-2 tablets by mouth every 6 hours as  needed for pain. 12/13/17   Pisciotta, Joni Reining, PA-C  ibuprofen (ADVIL,MOTRIN) 600 MG tablet Take 1 tablet (600 mg total) by mouth every 6 (six) hours as needed. 01/02/17   Kirichenko, Tatyana, PA-C  ondansetron (ZOFRAN) 4 MG tablet Take 1 tablet (4 mg total) by mouth every 8 (eight) hours as needed for nausea or vomiting. 06/19/20   Mallary Kreger, Veryl Speak, PA-C    Family History Family History  Family history unknown: Yes    Social History Social History   Tobacco Use   Smoking status: Current Every Day Smoker    Packs/day: 1.00    Types: Cigarettes   Smokeless tobacco: Never Used  Substance Use Topics   Alcohol use: Yes    Comment: occ   Drug use: No     Allergies   Patient has no known allergies.   Review of Systems Review of Systems   Physical Exam Triage Vital Signs ED Triage Vitals  Enc Vitals Group     BP 06/19/20 1511 (!) 136/91     Pulse Rate 06/19/20 1511 76     Resp 06/19/20 1511 16     Temp 06/19/20 1511 98.6 F (37 C)     Temp Source 06/19/20 1511 Oral     SpO2 06/19/20 1511 97 %     Weight --      Height --      Head Circumference --  Peak Flow --      Pain Score 06/19/20 1509 0     Pain Loc --      Pain Edu? --      Excl. in GC? --    No data found.  Updated Vital Signs BP (!) 136/91 (BP Location: Right Arm)    Pulse 76    Temp 98.6 F (37 C) (Oral)    Resp 16    SpO2 97%   Visual Acuity Right Eye Distance:   Left Eye Distance:   Bilateral Distance:    Right Eye Near:   Left Eye Near:    Bilateral Near:     Physical Exam Vitals and nursing note reviewed.  Constitutional:      General: He is not in acute distress.    Appearance: He is well-developed. He is not ill-appearing.  HENT:     Head: Normocephalic and atraumatic.     Nose: Congestion and rhinorrhea present.     Mouth/Throat:     Pharynx: Oropharynx is clear.  Eyes:     Extraocular Movements: Extraocular movements intact.     Conjunctiva/sclera: Conjunctivae normal.      Pupils: Pupils are equal, round, and reactive to light.  Cardiovascular:     Rate and Rhythm: Normal rate and regular rhythm.     Heart sounds: No murmur heard.   Pulmonary:     Effort: Pulmonary effort is normal. No respiratory distress.     Breath sounds: Normal breath sounds. No wheezing, rhonchi or rales.  Abdominal:     Palpations: Abdomen is soft.     Tenderness: There is no abdominal tenderness. There is no right CVA tenderness or left CVA tenderness.  Musculoskeletal:     Cervical back: Neck supple.  Skin:    General: Skin is warm and dry.  Neurological:     Mental Status: He is alert.      UC Treatments / Results  Labs (all labs ordered are listed, but only abnormal results are displayed) Labs Reviewed  NOVEL CORONAVIRUS, NAA (HOSP ORDER, SEND-OUT TO REF LAB; TAT 18-24 HRS)    EKG   Radiology No results found.  Procedures Procedures (including critical care time)  Medications Ordered in UC Medications - No data to display  Initial Impression / Assessment and Plan / UC Course  I have reviewed the triage vital signs and the nursing notes.  Pertinent labs & imaging results that were available during my care of the patient were reviewed by me and considered in my medical decision making (see chart for details).     #Viral illness Patient is a 44 year old presenting with viral illness.  Normal vital signs with reassuring exam.  Appears well here in clinic and is desiring food.  We will treat him symptomatically.  Discussed return, follow-up and emergency for precautions.  Encouraged him to establish primary care.  Patient verbalized agreement understanding plan of care Final diagnoses:  Close exposure to COVID-19 virus  Viral illness     Discharge Instructions     Take medicines as prescribed Take Zofran only if you are unable to tolerate liquids  If severe symptoms shortness of breath, severe abdominal pain or any to tolerate liquids or other  concerning symptoms go to emergency department   establish care with the internal medicine center    If your Covid-19 test is positive, you will receive a phone call from [facility] regarding your results. Negative test results are not called. Both positive and negative  results area always visible on MyChart. If you do not have a MyChart account, sign up instructions are in your discharge papers.   Persons who are directed to care for themselves at home may discontinue isolation under the following conditions:   At least 10 days have passed since symptom onset and  At least 24 hours have passed without running a fever (this means without the use of fever-reducing medications) and  Other symptoms have improved.  Persons infected with COVID-19 who never develop symptoms may discontinue isolation and other precautions 10 days after the date of their first positive COVID-19 test.     ED Prescriptions    Medication Sig Dispense Auth. Provider   ondansetron (ZOFRAN) 4 MG tablet Take 1 tablet (4 mg total) by mouth every 8 (eight) hours as needed for nausea or vomiting. 4 tablet Louie Flenner, Veryl Speak, PA-C   fluticasone (FLONASE) 50 MCG/ACT nasal spray Place 1 spray into both nostrils daily. 15.8 mL Kamyia Thomason, Veryl Speak, PA-C   guaiFENesin-dextromethorphan (ROBITUSSIN DM) 100-10 MG/5ML syrup Take 5 mLs by mouth every 4 (four) hours as needed for cough. 118 mL Brynne Doane, Veryl Speak, PA-C     PDMP not reviewed this encounter.   Hermelinda Medicus, PA-C 06/19/20 2308

## 2020-06-19 NOTE — Discharge Instructions (Signed)
Take medicines as prescribed Take Zofran only if you are unable to tolerate liquids  If severe symptoms shortness of breath, severe abdominal pain or any to tolerate liquids or other concerning symptoms go to emergency department   establish care with the internal medicine center    If your Covid-19 test is positive, you will receive a phone call from [facility] regarding your results. Negative test results are not called. Both positive and negative results area always visible on MyChart. If you do not have a MyChart account, sign up instructions are in your discharge papers.   Persons who are directed to care for themselves at home may discontinue isolation under the following conditions:   At least 10 days have passed since symptom onset and  At least 24 hours have passed without running a fever (this means without the use of fever-reducing medications) and  Other symptoms have improved.  Persons infected with COVID-19 who never develop symptoms may discontinue isolation and other precautions 10 days after the date of their first positive COVID-19 test.

## 2020-06-21 LAB — NOVEL CORONAVIRUS, NAA (HOSP ORDER, SEND-OUT TO REF LAB; TAT 18-24 HRS): SARS-CoV-2, NAA: NOT DETECTED

## 2020-06-23 ENCOUNTER — Ambulatory Visit: Payer: Self-pay

## 2022-09-28 ENCOUNTER — Ambulatory Visit (HOSPITAL_COMMUNITY)
Admission: EM | Admit: 2022-09-28 | Discharge: 2022-09-28 | Disposition: A | Discharge status: Home / Self Care | Payer: No Payment, Other | Attending: Psychiatry | Admitting: Psychiatry

## 2022-09-28 DIAGNOSIS — I10 Essential (primary) hypertension: Secondary | ICD-10-CM | POA: Insufficient documentation

## 2022-09-28 DIAGNOSIS — F1491 Cocaine use, unspecified, in remission: Secondary | ICD-10-CM

## 2022-09-28 DIAGNOSIS — F1411 Cocaine abuse, in remission: Secondary | ICD-10-CM | POA: Insufficient documentation

## 2022-09-28 DIAGNOSIS — Z79899 Other long term (current) drug therapy: Secondary | ICD-10-CM | POA: Insufficient documentation

## 2022-09-28 NOTE — Discharge Instructions (Signed)
Patient is instructed prior to discharge to:  Take all medications as prescribed by his/her mental healthcare provider. Report any adverse effects and or reactions from the medicines to his/her outpatient provider promptly. Keep all scheduled appointments, to ensure that you are getting refills on time and to avoid any interruption in your medication.  If you are unable to keep an appointment call to reschedule.  Be sure to follow-up with resources and follow-up appointments provided.  Patient has been instructed & cautioned: To not engage in alcohol and or illegal drug use while on prescription medicines. In the event of worsening symptoms, patient is instructed to call the crisis hotline, 911 and or go to the nearest ED for appropriate evaluation and treatment of symptoms. To follow-up with his/her primary care provider for your other medical issues, concerns and or health care needs.  Information: -National Suicide Prevention Lifeline 1-800-SUICIDE or 226-538-7606.  -988 offers 24/7 access to trained crisis counselors who can help people experiencing mental health-related distress. People can call or text 988 or chat 988lifeline.org for themselves or if they are worried about a loved one who may need crisis support.      Substance Abuse Treatment Resources listed Below:  [facility] - Admissions are currently completed Monday through Friday at [facility]; both appointments and walk-ins are accepted.  Any individual that is a [facility] resident may present for a substance abuse screening and assessment for admission.  A person may be referred by numerous sources or self-refer.   Potential clients will be screened for medical necessity and appropriateness for the program.  Clients must meet criteria for high-intensity residential treatment services.  If clinically appropriate, a client will continue with the comprehensive clinical assessment and intake process, as well  as enrollment in the [place].  Address: [address] [place], [place] [address] Admin Hours: [place] to [place] Hours: 24/7 Phone: [phone] Fax: [place] Address: [place], [place], [place] [address] Behavioral Health Urgent Care [facility]) Hours: 24/7 Phone: [phone] Fax: [phone]  [facility] ([facility]): (offers outpatient therapy and intensive outpatient substance abuse therapy).  [address] [place], [place] [address] Phone: [phone]  [facility]: Offers FREE recovery skills classes, support groups, 1:1 Peer Support, and Compeer Classes. [address], [place], [place] [address] Phone: [phone] (Call to complete intake).   [facility] [place], [place] [address] Phone: [phone] ext [phone] [facility] provides food, shelter and other programs and services to the homeless men of [place] through our [employer] program.  By offering safe shelter, three meals a day, clean clothing, Biblical counseling, financial planning, vocational training, GED/education and employment assistance, we've helped mend the shattered lives of many homeless men since opening in 1974.  We have approximately 267 beds available, with a max of 312 beds including mats for emergency situations and currently house an average of 270 men a night.  Prospective Client Check-In Information Photo ID Required (State/ Out of State/ [facility]) - if photo ID is not available, clients are required to have a printout of a police/sheriff's criminal history report. Help out with chores around the [place]. No sex offender of any type (pending, charged, registered and/or any other sex related offenses) will be permitted to check in. Must be willing to abide by all rules, regulations, and policies established by the [employer]. The  following  will be provided - shelter, food, clothing, and biblical counseling. If you or someone you know is in need of assistance at our men's shelter in [place], [place], please call [phone] ext. [phone].  [facility]-will provide timely access to mental health services for children and adolescents (4-17) and adults presenting in a mental health crisis. The program is designed for those who need urgent Behavioral Health or Substance Use treatment and are not experiencing a medical crisis that would typically require an emergency room visit.    [address] [place], [place] [address] Phone: [phone] [email or web address]  [facility]: Phone#: [phone]  The [facility] SA Intensive Outpatient Program (SAIOP) means structured individual and group addiction activities and services that are provided at an outpatient program designed to assist adult and adolescent consumers to begin recovery and learn skills for recovery maintenance. The [facility] SAIOP program is offered at least 3 hours a day, 3 days a week.SAIOP services shall include a structured program consisting of, but not limited to, the following services: Individual counseling and support; Group counseling and support; Family counseling, training or support; Biochemical assays to identify recent drug use (e.g., urine drug screens); Strategies for relapse prevention to include community and social support systems in treatment; Life skills; Crisis contingency planning; Disease Management; and Treatment support activities that have been adapted or specifically designed for persons with physical disabilities, or persons with co-occurring disorders of mental illness and substance abuse/dependence or mental retardation/developmental disability and substance abuse/dependence. Phone: [phone]  Address:   The [facility] will also offer the following outpatient  services: (Monday through Friday 8am-5pm)   Partial Hospitalization Program (PHP) Substance Abuse Intensive Outpatient Program (SA-IOP) Group Therapy Medication Management Peer Living Room We also provide (24/7):  Assessments: Our mental health clinician and providers will conduct a focused mental health evaluation, assessing for immediate safety concerns and further mental health needs. Referral: Our team will provide resources and help connect to community based mental health treatment, when indicated, including psychotherapy, psychiatry, and other specialized behavioral health or substance use disorder services (for those not already in treatment). Transitional Care: Our team providers in person bridging and/or telephonic follow-up during the patient's transition to outpatient services.  [facility] 24-Hour Call Center: 1-800-256-2452 Behavioral Health Crisis Line: [phone]  

## 2022-09-28 NOTE — Discharge Summary (Signed)
Nelle Don to be D/C'd Home per FNP order. An After Visit Summary was printed and given to the patient. Patient escorted out and D/C home via private auto.  Dickie La  09/28/2022 1:23 PM

## 2022-09-28 NOTE — ED Provider Notes (Signed)
Behavioral Health Urgent Care Medical Screening Exam  Patient Name: Warren Rosales MRN: [id number] Date of Evaluation: 09/28/22 Chief Complaint:   Diagnosis:  Final diagnoses:  Cocaine use disorder in remission    History of Present illness: Warren Rosales is a 46 y.o. male. Patient presents voluntarily to [facility] behavioral health for walk-in assessment.  Patient is assessed, face-to-face, by nurse practitioner. He is seated in assessment area, no acute distress. Consulted with provider, Dr.  Viviano Simas, and chart reviewed on 09/28/2022. He  is alert and oriented, pleasant and cooperative during assessment.   Warren Rosales would like to be linked with outpatient psychiatry.  He is also seeking community resources for food and clothing.   He denies formal mental health diagnosis.  He is not linked with outpatient psychiatry currently, no current psychotropic medications.  Patient believes he may meet criteria for PTSD as he had "a very difficult childhood."  He denies history of inpatient psychiatric hospitalization.  Family mental health history includes patient's paternal uncle who was diagnosed with PTSD.  Patient's sister diagnosed with anxiety and PTSD.  Patient reports recent stressors include history of cocaine use disorder.  He denies alcohol use, denies substance use aside from cocaine.  He reports daily use of crack cocaine "on and off" for 30 years.  He has graduated a 8-month substance use program [place] in [place] approximately 20 years ago.  He also graduated [facility] in [place] 3 months program in 2004.  He went to [facility] on Monday however left yesterday because "[facility] is like prison."  He is not seeking residential substance use treatment at this time.  Would like to attend "meetings." He is linked with Alcoholics/Narcotics Anonymous sponsor currently.  Patient is insightful, committed to his sobriety.  Last crack cocaine use approximately 3 years ago.  He reports has a new  grandchild born on 08/27/2022 and another new grandchild to be born in the coming days.  He states "I have no urge to use drugs, I have been missing parts of my family's life and I want to do better."  Patient also concerned with his health.  He has recently been seen in emergency department, emergency department staff notified patient "if you use cocaine your health will worsen, it is not of your heart."  Patient has been prescribed amlodipine for increased blood pressure.  He did not take amlodipine on yesterday as he forgot.  Current plan includes outpatient primary care follow-up.  Patient aware of elevated blood pressure today, attributes this to missed blood pressure medication dose.  Patient  presents with euthymic mood, congruent affect. He  denies suicidal and homicidal ideations. Denies history of suicide attempts, denies history of non suicidal self-harm behaviors.  Patient easily  contracts verbally for safety with this Clinical research associate.    Patient has normal speech and behavior.  He  denies auditory and visual hallucinations.  History of hearing "negative talk" including his own voice as well as the voice of others. Patient is able to converse coherently with goal-directed thoughts and no distractibility or preoccupation.  Denies symptoms of paranoia.  Objectively there is no evidence of psychosis/mania or delusional thinking.  Warren Rosales resides in [place] with his sister. He denies access to weapons. He is employed in the Product/process development scientist. Patient endorses average sleep and appetite.   Patient offered support and encouragement.  He verbalizes understanding of follow-up resources and strict return precautions.   Patient educated and verbalize understanding of mental health resources and other crisis services in the  community. They are instructed to call 911 and present to the nearest emergency room should patient experience any suicidal/homicidal ideation, auditory/visual/hallucinations, or  detrimental worsening of mental health condition.      Flowsheet Row ED from 09/28/2022 in [facility]  C-SSRS RISK CATEGORY No Risk       Psychiatric Specialty Exam  Presentation  General Appearance:Appropriate for Environment; Casual  Eye Contact:Good  Speech:Clear and Coherent; Normal Rate  Speech Volume:Normal  Handedness:Right   Mood and Affect  Mood: Euthymic  Affect: Appropriate; Congruent   Thought Process  Thought Processes: Coherent; Goal Directed; Linear  Descriptions of Associations:Intact  Orientation:Full (Time, Place and Person)  Thought Content:Logical    Hallucinations:None  Ideas of Reference:None  Suicidal Thoughts:No  Homicidal Thoughts:No   Sensorium  Memory: Immediate Good; Recent Good  Judgment: Good  Insight: Fair   Executive Functions  Concentration: Good  Attention Span: Good  Recall: Good  Fund of Knowledge: Good  Language: Good   Psychomotor Activity  Psychomotor Activity: Normal   Assets  Assets: Communication Skills; Desire for Improvement; Housing; Intimacy; Leisure Time; Resilience   Sleep  Sleep: Fair  Number of hours: No data recorded  No data recorded  Physical Exam: Physical Exam Vitals and nursing note reviewed.  Constitutional:      Appearance: He is well-developed.  HENT:     Head: Normocephalic.     Nose: Nose normal.  Cardiovascular:     Rate and Rhythm: Normal rate.  Pulmonary:     Effort: Pulmonary effort is normal.  Musculoskeletal:        General: Normal range of motion.     Cervical back: Normal range of motion.  Skin:    General: Skin is warm and dry.  Neurological:     Mental Status: He is alert and oriented to person, place, and time.  Psychiatric:        Attention and Perception: Attention and perception normal.        Mood and Affect: Mood and affect normal.        Speech: Speech normal.        Behavior: Behavior normal.  Behavior is cooperative.        Thought Content: Thought content normal.        Cognition and Memory: Cognition and memory normal.        Judgment: Judgment normal.    Review of Systems  Constitutional: Negative.   HENT: Negative.    Eyes: Negative.   Respiratory: Negative.    Cardiovascular: Negative.   Gastrointestinal: Negative.   Genitourinary: Negative.   Musculoskeletal: Negative.   Skin: Negative.   Neurological: Negative.   Psychiatric/Behavioral:  Positive for substance abuse.    Blood pressure (!) 177/103, pulse 64, temperature 98.2 F (36.8 C), temperature source Oral, resp. rate 18, SpO2 100 %. There is no height or weight on file to calculate BMI.  Musculoskeletal: Strength & Muscle Tone: within normal limits Gait & Station: normal Patient leans: N/A   [facility] MSE Discharge Disposition for Follow up and Recommendations: Based on my evaluation the patient does not appear to have an emergency medical condition and can be discharged with resources and follow up care in outpatient services for Medication Management and Individual Therapy Follow up with primary care, resources provided. [facility] community health and wellness information provided.  Follow up with outpatient psychiatry, appointment confirmed at [facility]. Patient considering walk-in hours Monday though Thursday.  Follow up with substance use  treatment options provided.    Lenard Lance, FNP 09/28/2022, 1:11 PM

## 2022-09-28 NOTE — ED Triage Notes (Signed)
Pt presents to [facility] accompanied by his sister seeking medication. Pt states "I want to get back on my mental health medicine". Pt reports being diagnosed with PTSD and "mild" schizophrenia. Pt states he would like to start outpatient services for therapy and psychiatry. Pt denies SI/HI,NSSIB, drugs and alcohol and AVH at this time.

## 2022-11-02 ENCOUNTER — Ambulatory Visit (HOSPITAL_COMMUNITY): Payer: No Payment, Other | Admitting: Student

## 2022-11-02 NOTE — Progress Notes (Deleted)
Psychiatric Initial Adult Assessment   Patient Identification: Warren Rosales MRN:  [id number] Date of Evaluation:  11/02/2022 Referral Source: *** Chief Complaint:  No chief complaint on file.   Visit Diagnosis: No diagnosis found.  History of Present Illness:  Warren Rosales is a 47 y.o. male ***  ***  Patient amenable to to *** after discussing the risks, benefits, and side effects. Other wise patient had no other questions or concerns and was amenable to plan per below.  Safety: ***. Patient contracted to safety, stated they would call ***. Patient *** aware of 988 and 911 as well.    ROS Associated Signs/Symptoms: Depression Symptoms:  Patient denied depressed mood and pervasive sadness, anhedonia, insomnia/hypersomnia, guilt, decreased energy, decreased concentration, decreased or increased appetite, psychomotor slowing, and suicidal ideation or intentions for at least 2 weeks. *** {DEPRESSION SYMPTOMS:20000} (Hypo) Manic Symptoms: Patient denied ever having symptoms of excessive energy despite decreased need for sleep (<2hr/night x4-7days), distractibility/inattention, sexual indiscretion, grandiosity/inflated self-esteem, flight of ideas, racing thoughts, pressured speech, or sexual-indiscretion. *** {BHH MANIC SYMPTOMS:22872} Anxiety Symptoms:  Patient denied having difficulty controlling/managing anxiety and that their anxiety is not out of proportion with stressors. Patient denied having difficulty controlling worry.   Patient denied that anxiety causes feelings of restlessness or being on edge, easily fatigued, concentration difficulty, irritability, muscle tension, and sleep disturbance. *** {BHH ANXIETY SYMPTOMS:22873} OCD Symptoms: Patient denied having recurrent/persistent thoughts, urges, intrusive and unwanted images causing anxiety/distress. Patient denied having obsessions as defined by an attempt to ignore or suppress thoughts, urges, images or neutralize them with  thought or action. *** Eating disorders: Patient denied binging, losing track of time eating, restricting, purging or other compensatory actions such as excessive excessive and fasting.  PTSD Symptoms: Patient denied exposure to life-threatening trauma, physical abuse, or sexual abuse. *** {BHH PTSD SYMPTOMS:22875} Psychotic Symptoms: Patient denied ever having AVH, delusions, paranoia, first rank symptoms. *** {BHH PSYCHOTIC EU:9022173  Past Psychiatric History:  Previous Psychotropic Medications: {YES/NO:21197} Suicide attempts: *** Inpatient psych admission: Denied Outpatient therapist: *** Trauma:  Dx: Cocaine use d/o, opioid use d/o Rx: ***  Family Psychiatric History:  Suicide: *** Psych admission: *** SCZ/SCzA or BiPD: *** Substance use: *** Others: Paternal uncle who was diagnosed with PTSD. Sister diagnosed with anxiety and PTSD    Additional Social History:  Living: With sister in [place]: Employed - Production designer, theatre/television/film Family: *** Social support: ***  Substance Abuse History in the last 12 months:  {yes no:314532} Consequences of Substance Abuse: {BHH CONSEQUENCES OF SUBSTANCE ABUSE:22880} Seizure/DT: ***  Treatments:  He has graduated a 64-monthsubstance use program [facility] in [facility] [place]s [place].  He is linked with Alcoholics/Narcotics Anonymous sponsor currently  IVDU: *** EtOH:  reports current alcohol use.  Tobacco:  reports that he has been smoking cigarettes. He has been smoking an average of 1 pack per day. He has never used smokeless tobacco.  Cannabis: *** Opiates: *** Stimulants: Cocaine on and off since 1990s (~47yo) BZO/hypnotics: ***  Past Medical History:  No past medical history on file.  No past surgical history on file. Family History:  Family History  Family history unknown: Yes   Social History:   Social History   Socioeconomic History   Marital  status: Married    Spouse name: Not on file   Number of children: Not on file   Years of education: Not on file   Highest education level:  Not on file  Occupational History   Not on file  Tobacco Use   Smoking status: Every Day    Packs/day: 1.00    Types: Cigarettes   Smokeless tobacco: Never  Substance and Sexual Activity   Alcohol use: Yes    Comment: occ   Drug use: No   Sexual activity: Not on file  Other Topics Concern   Not on file  Social History Narrative   Not on file   Social Determinants of Health   Financial Resource Strain: Not on file  Food Insecurity: Not on file  Transportation Needs: Not on file  Physical Activity: Not on file  Stress: Not on file  Social Connections: Not on file   Allergies:   No Known Allergies Metabolic Disorder Labs: No results found for: "HGBA1C", "MPG" No results found for: "PROLACTIN" No results found for: "CHOL", "TRIG", "HDL", "CHOLHDL", "VLDL", "LDLCALC" No results found for: "TSH"  Therapeutic Level Labs: No results found for: "LITHIUM" No results found for: "CBMZ" No results found for: "VALPROATE"  Current Medications: Current Outpatient Medications  Medication Sig Dispense Refill   amoxicillin (AMOXIL) 500 MG capsule Take 2 capsules (1,000 mg total) by mouth 2 (two) times daily. 40 capsule 0   fluticasone (FLONASE) 50 MCG/ACT nasal spray Place 1 spray into both nostrils daily. 15.8 mL 2   guaiFENesin-dextromethorphan (ROBITUSSIN DM) 100-10 MG/5ML syrup Take 5 mLs by mouth every 4 (four) hours as needed for cough. 118 mL 0   HYDROcodone-acetaminophen (NORCO/VICODIN) 5-325 MG tablet Take 1-2 tablets by mouth every 6 hours as needed for pain. 15 tablet 0   ibuprofen (ADVIL,MOTRIN) 600 MG tablet Take 1 tablet (600 mg total) by mouth every 6 (six) hours as needed. 30 tablet 0   ondansetron (ZOFRAN) 4 MG tablet Take 1 tablet (4 mg total) by mouth every 8 (eight) hours as needed for nausea or vomiting. 4 tablet 0   No  current facility-administered medications for this visit.    Musculoskeletal: Strength & Muscle Tone: {desc; muscle tone:32375} Gait & Station: {PE GAIT ED NATL:22525} Patient leans: {Patient Leans:21022755} Strength & Muscle Tone: within normal limits Gait & Station: normal Patient leans: N/A   Psychiatric Specialty Exam: There were no vitals taken for this visit.There is no height or weight on file to calculate BMI.  General Appearance: {Appearance:22683}  Eye Contact:  {BHH EYE CONTACT:22684}  Speech:  {Speech:22685}  Volume:  {Volume (PAA):22686}  Mood:  {BHH MOOD:22306}  Affect:  {Affect (PAA):22687}  Thought Process:  {Thought Process (PAA):22688}  Orientation:  {BHH ORIENTATION (PAA):22689}  Thought Content:  {Thought Content:22690}  Suicidal Thoughts:  {ST/HT (PAA):22692}  Homicidal Thoughts:  {ST/HT (PAA):22692}  Memory:  {BHH MEMORY:22881}  Judgement:  {Judgement (PAA):22694}  Insight:  {Insight (PAA):22695}  Psychomotor Activity:  {Psychomotor (PAA):22696}  Concentration:  {Concentration:21399}  Recall:  {BHH GOOD/FAIR/POOR:22877}  Fund of Knowledge:{BHH GOOD/FAIR/POOR:22877}  Language: {BHH GOOD/FAIR/POOR:22877}  Akathisia:  {BHH YES OR NO:22294}  Handed:  {Handed:22697}  AIMS (if indicated):  {Desc; done/not:10129}  Assets:  {Assets (PAA):22698}  ADL's:  {BHH XO:4411959  Cognition: {chl bhh cognition:304700322}  Sleep:  {BHH GOOD/FAIR/POOR:22877}   Screenings: Flowsheet Row ED from 09/28/2022 in [place] No Risk      Assessment and Plan:  Jorman Camillo is a 47 y.o. male ***  There are no diagnoses linked to this encounter.   Collaboration of Care: Staffed with attending Dr. Nehemiah Settle ***  Patient/Guardian was advised Release of Information must  be obtained prior to any record release in order to collaborate their care with an outside provider. Patient/Guardian was advised if they have not already  done so to contact the registration department to sign all necessary forms in order for [place] to release information regarding their care.   Consent: Patient/Guardian gives verbal consent for treatment and assignment of benefits for services provided during this visit. Patient/Guardian expressed understanding and agreed to proceed.   Signed: Merrily Brittle, DO Psychiatry Resident, PGY-2 [facility] 11/02/2022, 11:53 AM

## 2022-11-12 ENCOUNTER — Emergency Department (HOSPITAL_BASED_OUTPATIENT_CLINIC_OR_DEPARTMENT_OTHER): Payer: Medicaid Other

## 2022-11-12 ENCOUNTER — Other Ambulatory Visit: Payer: Self-pay

## 2022-11-12 ENCOUNTER — Encounter (HOSPITAL_BASED_OUTPATIENT_CLINIC_OR_DEPARTMENT_OTHER): Payer: Self-pay

## 2022-11-12 ENCOUNTER — Emergency Department (HOSPITAL_BASED_OUTPATIENT_CLINIC_OR_DEPARTMENT_OTHER)
Admission: EM | Admit: 2022-11-12 | Discharge: 2022-11-12 | Disposition: A | Discharge status: Home / Self Care | Payer: Self-pay | Attending: Emergency Medicine | Admitting: Emergency Medicine

## 2022-11-12 DIAGNOSIS — F17200 Nicotine dependence, unspecified, uncomplicated: Secondary | ICD-10-CM | POA: Insufficient documentation

## 2022-11-12 DIAGNOSIS — I10 Essential (primary) hypertension: Secondary | ICD-10-CM | POA: Insufficient documentation

## 2022-11-12 DIAGNOSIS — R519 Headache, unspecified: Secondary | ICD-10-CM | POA: Insufficient documentation

## 2022-11-12 DIAGNOSIS — R202 Paresthesia of skin: Secondary | ICD-10-CM | POA: Insufficient documentation

## 2022-11-12 DIAGNOSIS — Z79899 Other long term (current) drug therapy: Secondary | ICD-10-CM | POA: Insufficient documentation

## 2022-11-12 DIAGNOSIS — H9313 Tinnitus, bilateral: Secondary | ICD-10-CM | POA: Insufficient documentation

## 2022-11-12 DIAGNOSIS — R42 Dizziness and giddiness: Secondary | ICD-10-CM | POA: Insufficient documentation

## 2022-11-12 DIAGNOSIS — R200 Anesthesia of skin: Secondary | ICD-10-CM

## 2022-11-12 LAB — CBC WITH DIFFERENTIAL/PLATELET
Abs Immature Granulocytes: 0.02 10*3/uL (ref 0.00–0.07)
Basophils Absolute: 0 10*3/uL (ref 0.0–0.1)
Basophils Relative: 0 %
Eosinophils Absolute: 0 10*3/uL (ref 0.0–0.5)
Eosinophils Relative: 0 %
HCT: 42.8 % (ref 39.0–52.0)
Hemoglobin: 14.2 g/dL (ref 13.0–17.0)
Immature Granulocytes: 0 %
Lymphocytes Relative: 20 %
Lymphs Abs: 1.7 10*3/uL (ref 0.7–4.0)
MCH: 28.2 pg (ref 26.0–34.0)
MCHC: 33.2 g/dL (ref 30.0–36.0)
MCV: 84.9 fL (ref 80.0–100.0)
Monocytes Absolute: 0.9 10*3/uL (ref 0.1–1.0)
Monocytes Relative: 11 %
Neutro Abs: 5.7 10*3/uL (ref 1.7–7.7)
Neutrophils Relative %: 69 %
Platelets: 249 10*3/uL (ref 150–400)
RBC: 5.04 MIL/uL (ref 4.22–5.81)
RDW: 15.6 % — ABNORMAL HIGH (ref 11.5–15.5)
WBC: 8.3 10*3/uL (ref 4.0–10.5)
nRBC: 0 % (ref 0.0–0.2)

## 2022-11-12 LAB — COMPREHENSIVE METABOLIC PANEL
ALT: 20 U/L (ref 0–44)
AST: 24 U/L (ref 15–41)
Albumin: 4.2 g/dL (ref 3.5–5.0)
Alkaline Phosphatase: 68 U/L (ref 38–126)
Anion gap: 8 (ref 5–15)
BUN: 19 mg/dL (ref 6–20)
CO2: 26 mmol/L (ref 22–32)
Calcium: 9.3 mg/dL (ref 8.9–10.3)
Chloride: 107 mmol/L (ref 98–111)
Creatinine, Ser: 1.2 mg/dL (ref 0.61–1.24)
GFR, Estimated: >60 mL/min (ref >60)
Glucose, Bld: 100 mg/dL — ABNORMAL HIGH (ref 70–99)
Potassium: 3.8 mmol/L (ref 3.5–5.1)
Sodium: 141 mmol/L (ref 135–145)
Total Bilirubin: 0.3 mg/dL (ref 0.3–1.2)
Total Protein: 6.8 g/dL (ref 6.5–8.1)

## 2022-11-12 LAB — RAPID URINE DRUG SCREEN, HOSP PERFORMED
Amphetamines: NOT DETECTED
Barbiturates: NOT DETECTED
Benzodiazepines: NOT DETECTED
Cocaine: NOT DETECTED
Opiates: NOT DETECTED
Tetrahydrocannabinol: NOT DETECTED

## 2022-11-12 NOTE — ED Notes (Signed)
Declining transport to [facility] for MRI and to see neurology. Leaving AMA. Encouraged pt to: have close f/u, return for worsening, new sx, or if he changes his mind. Denies questions or needs. PCP f/u encouraged and explained.

## 2022-11-12 NOTE — ED Notes (Signed)
Pt alert, NAD, calm, interactive, speech clear. Endorses HA, fogginess, bilateral ear ringing, and L hand numbness/ decreased sensation, dropping things. Denies CP, sob, dizziness. Onset 3d ago.

## 2022-11-12 NOTE — Discharge Instructions (Signed)
You would benefit from an appointment and establishing a primary care physician.  The number to the [facility] health community health and wellness clinic to test your chart, please schedule an appointment for further management.

## 2022-11-12 NOTE — ED Notes (Signed)
ED PA at [facility] 

## 2022-11-12 NOTE — ED Notes (Signed)
No changes, to CT °

## 2022-11-12 NOTE — ED Provider Notes (Addendum)
[facility] Provider Note   CSN: [id number] Arrival date & time: 11/12/22  1304     History Cocaine abuse, polysubstance abuse, HTN Chief Complaint  Patient presents with   Headache    Warren Rosales is a 47 y.o. male.  47 y.o male with a PMH cocaine abuse, polysubstance abuse, hypertension presents to the ED with a chief complaint of left arm tingling which began 3 days ago.  Patient endorses his blood pressure has been running elevated over the last couple days but he also has been out of his blood pressure medication for a month.  He states "I do not feel as sharp as I once was ".  He also endorses ringing of bilateral ears.  He feels that his left arm is numb, symptoms exacerbated with trying to grasp objects, he feels like he is weaker and he is dropping objects on his left hand.  He also reports he has been dropping things from his left hand.  Patient was recently discharged from rehabilitation, reports his last use of cocaine was approximately December 10.  He does endorse tobacco use daily.  He is also endorsing a headache, throbbing sensation to the frontal aspect of his head no alleviating or exacerbating factors.  He denies any prior history of CVA, prior history of CAD, no chest pain or shortness of breath.  The history is provided by the patient and medical records.  Headache Pain location:  Frontal Quality:  Sharp Radiates to:  Does not radiate Associated symptoms: dizziness   Associated symptoms: no abdominal pain, no back pain, no fever, no nausea, no sore throat and no vomiting        Home Medications Prior to Admission medications   Medication Sig Start Date End Date Taking? Authorizing Provider  amoxicillin (AMOXIL) 500 MG capsule Take 2 capsules (1,000 mg total) by mouth 2 (two) times daily. 12/13/17   Pisciotta, Elmyra Ricks, PA-C  fluticasone (FLONASE) 50 MCG/ACT nasal spray Place 1 spray into both nostrils daily. 06/19/20    Darr, Edison Nasuti, PA-C  guaiFENesin-dextromethorphan (ROBITUSSIN DM) 100-10 MG/5ML syrup Take 5 mLs by mouth every 4 (four) hours as needed for cough. 06/19/20   Darr, Edison Nasuti, PA-C  HYDROcodone-acetaminophen (NORCO/VICODIN) 5-325 MG tablet Take 1-2 tablets by mouth every 6 hours as needed for pain. 12/13/17   Pisciotta, Elmyra Ricks, PA-C  ibuprofen (ADVIL,MOTRIN) 600 MG tablet Take 1 tablet (600 mg total) by mouth every 6 (six) hours as needed. 01/02/17   Kirichenko, Tatyana, PA-C  ondansetron (ZOFRAN) 4 MG tablet Take 1 tablet (4 mg total) by mouth every 8 (eight) hours as needed for nausea or vomiting. 06/19/20   Darr, Edison Nasuti, PA-C      Allergies    Patient has no known allergies.    Review of Systems   Review of Systems  Constitutional:  Negative for chills and fever.  HENT:  Negative for sore throat.   Respiratory:  Negative for shortness of breath.   Cardiovascular:  Negative for chest pain.  Gastrointestinal:  Negative for abdominal pain, nausea and vomiting.  Genitourinary:  Negative for flank pain.  Musculoskeletal:  Negative for back pain.  Neurological:  Positive for dizziness and headaches.  All other systems reviewed and are negative.   Physical Exam Updated Vital Signs BP (!) 156/93   Pulse 69   Temp 98.7 F (37.1 C) (Oral)   Resp 15   Ht 5' 9.5" (1.765 m)   Wt 90.7 kg   SpO2 96%  BMI 29.11 kg/m  Physical Exam Vitals and nursing note reviewed.  Constitutional:      Appearance: He is well-developed.  HENT:     Head: Normocephalic and atraumatic.  Cardiovascular:     Rate and Rhythm: Normal rate.  Pulmonary:     Effort: Pulmonary effort is normal.     Breath sounds: No wheezing or rales.  Abdominal:     Palpations: Abdomen is soft.  Musculoskeletal:     Cervical back: Normal range of motion and neck supple. No rigidity.  Skin:    General: Skin is warm and dry.  Neurological:     Mental Status: He is alert and oriented to person, place, and time.     GCS: GCS eye  subscore is 4. GCS verbal subscore is 5. GCS motor subscore is 6.     Cranial Nerves: No dysarthria or facial asymmetry.     Sensory: Sensory deficit present.     Motor: Weakness present.     Comments: Alert, oriented, thought content appropriate. Speech fluent without evidence of aphasia. Able to follow 2 step commands without difficulty.  Cranial Nerves:  II:  Peripheral visual fields grossly normal, pupils, round, reactive to light III,IV, VI: ptosis not present, extra-ocular motions intact bilaterally  V,VII: smile symmetric, facial light touch sensation equal VIII: hearing grossly normal bilaterally  IX,X: midline uvula rise  XI: bilateral shoulder shrug equal and strong XII: midline tongue extension  Motor:  5/5 in RIGHT upper and lower extremities bilaterally including strong and equal grip strength and dorsiflexion/plantar flexion 4/5 strength to the left upper extremity  Sensory: light touch normal in all extremities.  Cerebellar: normal finger-to-nose with bilateral upper extremities, pronator drift negative Gait: normal gait and balance       ED Results / Procedures / Treatments   Labs (all labs ordered are listed, but only abnormal results are displayed) Labs Reviewed  CBC WITH DIFFERENTIAL/PLATELET - Abnormal; Notable for the following components:      Result Value   RDW 15.6 (*)    All other components within normal limits  COMPREHENSIVE METABOLIC PANEL - Abnormal; Notable for the following components:   Glucose, Bld 100 (*)    All other components within normal limits  RAPID URINE DRUG SCREEN, HOSP PERFORMED    EKG None  Radiology CT Head Wo Contrast  Result Date: 11/12/2022 CLINICAL DATA:  Numbness and tingling.  Paresthesias. EXAM: CT HEAD WITHOUT CONTRAST TECHNIQUE: Contiguous axial images were obtained from the base of the skull through the vertex without intravenous contrast. RADIATION DOSE REDUCTION: This exam was performed according to the departmental  dose-optimization program which includes automated exposure control, adjustment of the mA and/or kV according to patient size and/or use of iterative reconstruction technique. COMPARISON:  None Available. FINDINGS: Brain: No evidence of acute infarction, hemorrhage, hydrocephalus, extra-axial collection or mass lesion/mass effect. Vascular: No hyperdense vessel or unexpected calcification. Skull: Normal. Negative for fracture or focal lesion. Sinuses/Orbits: No middle ear or mastoid effusion. Near-complete opacification of the right maxillary sinus and mild mucosal thickening of the right sphenoid sinus. There is a chronic fracture of the lamina papyracea and the orbital floor on the right. Other: None. IMPRESSION: 1. No acute intracranial abnormality. 2. Chronic fracture of the lamina papyracea and the orbital floor on the right. Electronically Signed   By: Marin Roberts M.D.   On: 11/12/2022 14:46    Procedures Procedures    Medications Ordered in ED Medications - No data to display  ED Course/ Medical Decision Making/ A&P                             Medical Decision Making Amount and/or Complexity of Data Reviewed Labs: ordered. Radiology: ordered.   This patient presents to the ED for concern of left arm numbness, this involves a number of treatment options, and is a complaint that carries with it a high risk of complications and morbidity.  The differential diagnosis includes CVA, ACS, cervical radiculopathy.    Co morbidities: Discussed in HPI   Brief History:  Patient with underlying history of cocaine abuse presents to the ED with a chief complaint of left arm numbness that is been ongoing for the past 3 days.  Does report history of high blood pressure however blood pressure has been running elevated over the last couple of days as he has not taken his blood pressure medication over a month.  Patient was in rehabilitation and has not used cocaine since September 18, 2022.  Also  reports he "I do not feel as sharp as I once used to ".  No trauma, no blood thinners, no prior history of CVA.  EMR reviewed including pt PMHx, past surgical history and past visits to ER.   See HPI for more details   Lab Tests:  I ordered and independently interpreted labs.  The pertinent results include:    I personally reviewed all laboratory work and imaging. Metabolic panel without any acute abnormality specifically kidney function within normal limits and no significant electrolyte abnormalities. CBC without leukocytosis or significant anemia.   Imaging Studies:  NAD. I personally reviewed all imaging studies and no acute abnormality found. I agree with radiology interpretation.   Cardiac Monitoring:  The patient was maintained on a cardiac monitor.  I personally viewed and interpreted the cardiac monitored which showed an underlying rhythm of:NSR 78 EKG non-ischemic  Consults:  I requested consultation with Dr. Quinn Axe, neurology,  and discussed lab and imaging findings as well as pertinent plan - they recommend: MRI Brain transfer to [facility].   Reevaluation:  After the interventions noted above I re-evaluated patient and found that they have :stayed the same   Social Determinants of Health:  The patient's social determinants of health were a factor in the care of this patient   Problem List / ED Course:  Patient presents to the ED with a chief complaint of left arm numbness which has been ongoing for the past 3 days, elevated blood pressure over the last several days.  Does report he ran out of his blood pressure medication approximately a month ago.  Patient was in a rehabilitation facility and denies any cocaine use over the last month.  He does report he is "not as sharp as it used to be ".  On exam his left upper extremity does appear decreased in strength, there is no ataxia on my exam.  He is ambulatory with a steady gait.  Sensation is intact throughout.  He  does have some problems with dexterity along with grabbing his glasses at the corner.  His labs are normal CBC with no leukocytosis, hemoglobin is unremarkable.  CMP with no electrolyte derangement, current levels within normal limits.  LFTs are unremarkable.  UDS is currently pending concern for CVA with an underlying history of cocaine.  CT head did not show any acute findings.  I discussed this case with neurology Dr. Quinn Axe who recommended MRI  brain, unfortunately due to our facility limited on imaging we will need to transfer him over to [facility] order to obtain an MRI.  Patient was informed of this vitals have remained stable, blood pressure is only elevated. I attempted to transfer patient over to [facility] order to obtain MRI, however patient now has declining to go have MRI, would like to have this schedule.  At this time patient is refusing care and is leaving [place] as he reports he is hungry.  He would like his blood pressure medication prescribed, however according to prior records I do not see what medication he was on and I am hesistant to provide him with BP meds as BP has been slightly elevated today.   Dispostion:  Patient leaving the ED against medical advice.   Portions of this note were generated with [employer]obby[employer]. Dictation errors may occur despite best attempts at proofreading.   Final Clinical Impression(s) / ED Diagnoses Final diagnoses:  Left arm numbness    Rx / DC Orders ED Discharge Orders     None         Janeece Fitting, PA-C 11/12/22 [place], Elison Worrel, PA-C 11/12/22 [place], DO 11/18/22 [phone]

## 2022-11-12 NOTE — ED Triage Notes (Signed)
Patient here Pov from Home.  Endorses being without January 10th. Past few days the Patient has had Numbness to Left Hand, some Fogginess, and a Headache.   No Nausea currently. No CP Currently. No New SOB.   NAD Noted during Triage. A&Ox4. GCS 15. Ambulatory.

## 2022-11-12 NOTE — ED Notes (Addendum)
Called [facility] -- requested transpot Ed to Ed via [facility]; accepting the patient: Regan Lemming, MD [UPDATE: [place]

## 2023-05-17 ENCOUNTER — Emergency Department (HOSPITAL_COMMUNITY)
Admission: EM | Admit: 2023-05-17 | Discharge: 2023-05-17 | Discharge status: Against Medical Advice | Payer: MEDICAID | Attending: Emergency Medicine | Admitting: Emergency Medicine

## 2023-05-17 ENCOUNTER — Encounter (HOSPITAL_COMMUNITY): Payer: Self-pay

## 2023-05-17 DIAGNOSIS — U071 COVID-19: Secondary | ICD-10-CM | POA: Diagnosis not present

## 2023-05-17 DIAGNOSIS — Z5321 Procedure and treatment not carried out due to patient leaving prior to being seen by health care provider: Secondary | ICD-10-CM | POA: Diagnosis not present

## 2023-05-17 DIAGNOSIS — R059 Cough, unspecified: Secondary | ICD-10-CM | POA: Diagnosis present

## 2023-05-17 NOTE — ED Notes (Signed)
Pt notified staff that he was leaving. Staff removed wristband and collected labels to be shredded.

## 2023-05-17 NOTE — ED Triage Notes (Signed)
Pt tested COVID this morning and was sent here to receive " a shot " to help with the pain, pt does appear with a congested nose, cough and generally feeling unwell. Pt has a secondary follow up complaint of being out of his blood pressure medication which he is attributing to having symptoms on bilateral numbness in his finger tips. Pt has been taking dayquil throughout the week.

## 2023-05-18 ENCOUNTER — Emergency Department (HOSPITAL_COMMUNITY)
Admission: EM | Admit: 2023-05-18 | Discharge: 2023-05-18 | Disposition: A | Discharge status: Home / Self Care | Payer: MEDICAID | Attending: Emergency Medicine | Admitting: Emergency Medicine

## 2023-05-18 DIAGNOSIS — I10 Essential (primary) hypertension: Secondary | ICD-10-CM | POA: Insufficient documentation

## 2023-05-18 DIAGNOSIS — U071 COVID-19: Secondary | ICD-10-CM | POA: Insufficient documentation

## 2023-05-18 DIAGNOSIS — R509 Fever, unspecified: Secondary | ICD-10-CM | POA: Diagnosis present

## 2023-05-18 MED ORDER — IBUPROFEN 800 MG PO TABS
800.0000 mg | ORAL_TABLET | Freq: Once | ORAL | Status: AC
  Administered 2023-05-18: 800 mg via ORAL
  Filled 2023-05-18: qty 1

## 2023-05-18 MED ORDER — ACETAMINOPHEN 325 MG PO TABS
650.0000 mg | ORAL_TABLET | Freq: Once | ORAL | Status: AC
  Administered 2023-05-18: 650 mg via ORAL
  Filled 2023-05-18: qty 2

## 2023-05-18 MED ORDER — IBUPROFEN 400 MG PO TABS: 400.0000 mg | ORAL_TABLET | Freq: Four times a day (QID) | ORAL | 0 refills | Status: AC | PRN

## 2023-05-18 MED ORDER — BENZONATATE 100 MG PO CAPS
100.0000 mg | ORAL_CAPSULE | Freq: Once | ORAL | Status: AC
  Administered 2023-05-18: 100 mg via ORAL
  Filled 2023-05-18: qty 1

## 2023-05-18 MED ORDER — BENZONATATE 100 MG PO CAPS: 100.0000 mg | ORAL_CAPSULE | Freq: Three times a day (TID) | ORAL | 0 refills | Status: AC

## 2023-05-18 MED ORDER — GUAIFENESIN-DM 100-10 MG/5ML PO SYRP: 5.0000 mL | ORAL_SOLUTION | ORAL | 0 refills | Status: AC | PRN

## 2023-05-18 NOTE — ED Notes (Signed)
Awaiting patient from lobby 

## 2023-05-18 NOTE — ED Provider Notes (Signed)
[place] EMERGENCY DEPARTMENT AT [facility] Provider Note   CSN: [id number] Arrival date & time: 05/18/23  1044     History  Chief Complaint  Patient presents with   Nasal Congestion   Chills   Hypertension   COVID    Warren Rosales is a 47 y.o. male.  The history is provided by the patient and medical records. No language interpreter was used.  Hypertension     47 year old male who lives in the shelter recently diagnosed with COVID yesterday presenting with complaint of cold symptoms.  Patient report for the past 5 days he has had fever, chills, body aches, congestion, sneezing, coughing, headache, and overall not feeling well.  Was diagnosed with COVID and was given Tylenol.  He is here today requesting for "a shot" to take away his infection.  He does not endorse any nausea vomiting or diarrhea or shortness of breath.  No urinary symptoms.  Home Medications Prior to Admission medications   Medication Sig Start Date End Date Taking? Authorizing Provider  amoxicillin (AMOXIL) 500 MG capsule Take 2 capsules (1,000 mg total) by mouth 2 (two) times daily. 12/13/17   Pisciotta, Joni Reining, PA-C  fluticasone (FLONASE) 50 MCG/ACT nasal spray Place 1 spray into both nostrils daily. 06/19/20   Darr, Gerilyn Pilgrim, PA-C  guaiFENesin-dextromethorphan (ROBITUSSIN DM) 100-10 MG/5ML syrup Take 5 mLs by mouth every 4 (four) hours as needed for cough. 06/19/20   Darr, Gerilyn Pilgrim, PA-C  HYDROcodone-acetaminophen (NORCO/VICODIN) 5-325 MG tablet Take 1-2 tablets by mouth every 6 hours as needed for pain. 12/13/17   Pisciotta, Joni Reining, PA-C  ibuprofen (ADVIL,MOTRIN) 600 MG tablet Take 1 tablet (600 mg total) by mouth every 6 (six) hours as needed. 01/02/17   Kirichenko, Tatyana, PA-C  ondansetron (ZOFRAN) 4 MG tablet Take 1 tablet (4 mg total) by mouth every 8 (eight) hours as needed for nausea or vomiting. 06/19/20   Darr, Gerilyn Pilgrim, PA-C      Allergies    Patient has no known allergies.    Review of Systems    Review of Systems  All other systems reviewed and are negative.   Physical Exam Updated Vital Signs BP (!) 158/100 (BP Location: Right Arm)   Pulse 70   Temp 98.3 F (36.8 C) (Oral)   Resp 20   Ht 5' 10.5" (1.791 m)   Wt 99.8 kg   SpO2 95%   BMI 31.12 kg/m  Physical Exam Vitals and nursing note reviewed.  Constitutional:      General: He is not in acute distress.    Appearance: He is well-developed.     Comments: Patient is sneezing and coughing the room but nontoxic in appearance  HENT:     Head: Atraumatic.  Eyes:     Conjunctiva/sclera: Conjunctivae normal.  Cardiovascular:     Rate and Rhythm: Normal rate and regular rhythm.     Pulses: Normal pulses.     Heart sounds: Normal heart sounds.  Pulmonary:     Breath sounds: No wheezing or rales.  Abdominal:     Palpations: Abdomen is soft.  Musculoskeletal:     Cervical back: Neck supple.  Skin:    Findings: No rash.  Neurological:     Mental Status: He is alert. Mental status is at baseline.     ED Results / Procedures / Treatments   Labs (all labs ordered are listed, but only abnormal results are displayed) Labs Reviewed - No data to display  EKG None  Radiology No results  found.  Procedures Procedures    Medications Ordered in ED Medications  benzonatate (TESSALON) capsule 100 mg (has no administration in time range)  ibuprofen (ADVIL) tablet 800 mg (has no administration in time range)  acetaminophen (TYLENOL) tablet 650 mg (650 mg Oral Given 05/18/23 1133)    ED Course/ Medical Decision Making/ A&P                                 Medical Decision Making Risk OTC drugs. Prescription drug management.   BP (!) 158/100 (BP Location: Right Arm)   Pulse 70   Temp 98.3 F (36.8 C) (Oral)   Resp 20   Ht 5' 10.5" (1.791 m)   Wt 99.8 kg   SpO2 95%   BMI 31.12 kg/m   58:35 PM   47 year old male who lives in the shelter recently diagnosed with COVID yesterday presenting with complaint of  cold symptoms.  Patient report for the past 5 days he has had fever, chills, body aches, congestion, sneezing, coughing, headache, and overall not feeling well.  Was diagnosed with COVID and was given Tylenol.  He is here today requesting for "a shot" to take away his infection.  He does not endorse any nausea vomiting or diarrhea or shortness of breath.  No urinary symptoms.  On exam, patient is sneezing and coughing the room however nontoxic in appearance.  Heart with normal rate and rhythm, lungs are clear to auscultation bilaterally abdomen is soft and nontender normal skin turgor  Vital signs reveal overall reassuring.  Elevated blood pressure of 150/100.  Patient does have known history of hypertension.  Patient is outside the window to be treated with antiviral medication.  Will provide supportive care.  Low suspicion for pneumonia or other acute emergent medical condition that require hospitalization.  Felt patient is stable to be discharged home with supportive care.  Gave patient return precaution.          Final Clinical Impression(s) / ED Diagnoses Final diagnoses:  COVID-19 virus infection    Rx / DC Orders ED Discharge Orders          Ordered    benzonatate (TESSALON) 100 MG capsule  Every 8 hours        05/18/23 1508    ibuprofen (ADVIL) 400 MG tablet  Every 6 hours PRN        05/18/23 1508    guaiFENesin-dextromethorphan (ROBITUSSIN DM) 100-10 MG/5ML syrup  Every 4 hours PRN        05/18/23 1508              Fayrene Helper, PA-C 05/18/23 1509    Terald Sleeper, MD 05/20/23 1201

## 2023-05-18 NOTE — ED Triage Notes (Signed)
Pt. Stated, I was dx with COVID yesterday at the shelter and I've had nasal congestion with sweat and chills. My BP is also high

## 2023-05-18 NOTE — Discharge Instructions (Signed)
Recommendations for at home COVID-19 symptoms management:  Please continue isolation at home.  If have acute worsening of symptoms please go to ER/urgent care for further evaluation. Check pulse oximetry and if below 90-92% please go to ER. The following supplements MAY help:  Vitamin C 500mg twice a day and Quercetin 250-500 mg twice a day Vitamin D3 2000 - 4000 u/day B Complex vitamins Zinc 75-100 mg/day Melatonin 6-10 mg at night (the optimal dose is unknown) Aspirin 81mg/day (if no history of bleeding issues)  

## 2023-07-03 ENCOUNTER — Encounter (HOSPITAL_BASED_OUTPATIENT_CLINIC_OR_DEPARTMENT_OTHER): Payer: Self-pay | Admitting: Pediatrics

## 2023-07-03 ENCOUNTER — Emergency Department (HOSPITAL_BASED_OUTPATIENT_CLINIC_OR_DEPARTMENT_OTHER)
Admission: EM | Admit: 2023-07-03 | Discharge: 2023-07-03 | Disposition: A | Discharge status: Home / Self Care | Payer: MEDICAID | Attending: Emergency Medicine | Admitting: Emergency Medicine

## 2023-07-03 ENCOUNTER — Emergency Department (HOSPITAL_BASED_OUTPATIENT_CLINIC_OR_DEPARTMENT_OTHER): Payer: MEDICAID | Admitting: Radiology

## 2023-07-03 ENCOUNTER — Other Ambulatory Visit: Payer: Self-pay

## 2023-07-03 DIAGNOSIS — F17210 Nicotine dependence, cigarettes, uncomplicated: Secondary | ICD-10-CM | POA: Insufficient documentation

## 2023-07-03 DIAGNOSIS — I10 Essential (primary) hypertension: Secondary | ICD-10-CM | POA: Diagnosis not present

## 2023-07-03 DIAGNOSIS — R519 Headache, unspecified: Secondary | ICD-10-CM | POA: Diagnosis present

## 2023-07-03 DIAGNOSIS — Z79899 Other long term (current) drug therapy: Secondary | ICD-10-CM | POA: Diagnosis not present

## 2023-07-03 DIAGNOSIS — I159 Secondary hypertension, unspecified: Secondary | ICD-10-CM | POA: Insufficient documentation

## 2023-07-03 HISTORY — DX: Essential (primary) hypertension: I10

## 2023-07-03 LAB — TROPONIN I (HIGH SENSITIVITY)
Troponin I (High Sensitivity): 11 ng/L (ref <18)
Troponin I (High Sensitivity): 12 ng/L (ref <18)

## 2023-07-03 LAB — BASIC METABOLIC PANEL
Anion gap: 7 (ref 5–15)
BUN: 21 mg/dL — ABNORMAL HIGH (ref 6–20)
CO2: 26 mmol/L (ref 22–32)
Calcium: 9.1 mg/dL (ref 8.9–10.3)
Chloride: 108 mmol/L (ref 98–111)
Creatinine, Ser: 1.47 mg/dL — ABNORMAL HIGH (ref 0.61–1.24)
GFR, Estimated: 60 mL/min — ABNORMAL LOW (ref >60)
Glucose, Bld: 107 mg/dL — ABNORMAL HIGH (ref 70–99)
Potassium: 3.9 mmol/L (ref 3.5–5.1)
Sodium: 141 mmol/L (ref 135–145)

## 2023-07-03 LAB — CBC
HCT: 43.2 % (ref 39.0–52.0)
Hemoglobin: 14.5 g/dL (ref 13.0–17.0)
MCH: 28.4 pg (ref 26.0–34.0)
MCHC: 33.6 g/dL (ref 30.0–36.0)
MCV: 84.7 fL (ref 80.0–100.0)
Platelets: 212 10*3/uL (ref 150–400)
RBC: 5.1 MIL/uL (ref 4.22–5.81)
RDW: 16 % — ABNORMAL HIGH (ref 11.5–15.5)
WBC: 9.4 10*3/uL (ref 4.0–10.5)
nRBC: 0 % (ref 0.0–0.2)

## 2023-07-03 MED ORDER — AMLODIPINE BESYLATE 5 MG PO TABS
5.0000 mg | ORAL_TABLET | Freq: Every day | ORAL | 0 refills | Status: AC
  Filled 2023-07-03: qty 30, 30d supply, fill #0

## 2023-07-03 MED ORDER — SODIUM CHLORIDE 0.9 % IV BOLUS
1000.0000 mL | Freq: Once | INTRAVENOUS | Status: AC
  Administered 2023-07-03: 1000 mL via INTRAVENOUS

## 2023-07-03 MED ORDER — ONDANSETRON HCL 4 MG/2ML IJ SOLN
4.0000 mg | Freq: Once | INTRAMUSCULAR | Status: AC
  Administered 2023-07-03: 4 mg via INTRAVENOUS
  Filled 2023-07-03: qty 2

## 2023-07-03 MED ORDER — AMLODIPINE BESYLATE 5 MG PO TABS
5.0000 mg | ORAL_TABLET | Freq: Once | ORAL | Status: AC
  Administered 2023-07-03: 5 mg via ORAL
  Filled 2023-07-03: qty 1

## 2023-07-03 NOTE — ED Notes (Signed)
Report given to the next RN... 

## 2023-07-03 NOTE — ED Provider Notes (Addendum)
[place] EMERGENCY DEPARTMENT AT [facility] Provider Note   CSN: [id number] Arrival date & time: 07/03/23  1438     History  Chief Complaint  Patient presents with   Hypertension    Warren Rosales is a 47 y.o. male with a past medical history of hypertension who presents to the ED for hypertension.  Patient was scheduled to have a routine dental extracted at his dentist office today and was sent here for further evaluation given concern for hypertension.  He reports systolic blood pressure was in the 180s at the next office earlier today.  He does have a known history of hypertension and was prescribed an antihypertensive from his primary care doctor but has not yet picked up this medication due to issues with insurance coverage.  He does report generalized headaches over the last couple weeks along with sensation of chest "tightness" during the last week.  Denies shortness of breath nausea vomiting fevers and chills.  Smokes 1 pack/day denies alcohol and other drug use.   Hypertension Associated symptoms include headaches.       Home Medications Prior to Admission medications   Medication Sig Start Date End Date Taking? Authorizing Provider  amLODipine (NORVASC) 5 MG tablet Take 1 tablet (5 mg total) by mouth daily. 07/03/23  Yes Royanne Foots, DO  amoxicillin (AMOXIL) 500 MG capsule Take 2 capsules (1,000 mg total) by mouth 2 (two) times daily. 12/13/17   Pisciotta, Joni Reining, PA-C  benzonatate (TESSALON) 100 MG capsule Take 1 capsule (100 mg total) by mouth every 8 (eight) hours. 05/18/23   Fayrene Helper, PA-C  fluticasone (FLONASE) 50 MCG/ACT nasal spray Place 1 spray into both nostrils daily. 06/19/20   Darr, Gerilyn Pilgrim, PA-C  guaiFENesin-dextromethorphan (ROBITUSSIN DM) 100-10 MG/5ML syrup Take 5 mLs by mouth every 4 (four) hours as needed for cough. 05/18/23   Fayrene Helper, PA-C  HYDROcodone-acetaminophen (NORCO/VICODIN) 5-325 MG tablet Take 1-2 tablets by mouth every 6 hours as  needed for pain. 12/13/17   Pisciotta, Joni Reining, PA-C  ibuprofen (ADVIL) 400 MG tablet Take 1 tablet (400 mg total) by mouth every 6 (six) hours as needed. 05/18/23   Fayrene Helper, PA-C  ondansetron (ZOFRAN) 4 MG tablet Take 1 tablet (4 mg total) by mouth every 8 (eight) hours as needed for nausea or vomiting. 06/19/20   Darr, Gerilyn Pilgrim, PA-C      Allergies    Patient has no known allergies.    Review of Systems   Review of Systems  Respiratory:  Positive for chest tightness.   Neurological:  Positive for headaches.  All other systems reviewed and are negative.   Physical Exam Updated Vital Signs BP (!) 169/90 (BP Location: Right Arm)   Pulse 60   Temp 97.7 F (36.5 C)   Resp 17   Ht 5\' 10"  (1.778 m)   Wt 104.8 kg   SpO2 96%   BMI 33.15 kg/m  Physical Exam Vitals and nursing note reviewed.  HENT:     Head: Normocephalic and atraumatic.  Cardiovascular:     Rate and Rhythm: Normal rate and regular rhythm.  Pulmonary:     Effort: Pulmonary effort is normal.  Abdominal:     General: There is no distension.  Musculoskeletal:        General: No swelling.  Neurological:     Mental Status: He is alert.  Psychiatric:        Mood and Affect: Mood normal.     ED Results / Procedures / Treatments  Labs (all labs ordered are listed, but only abnormal results are displayed) Labs Reviewed  BASIC METABOLIC PANEL - Abnormal; Notable for the following components:      Result Value   Glucose, Bld 107 (*)    BUN 21 (*)    Creatinine, Ser 1.47 (*)    GFR, Estimated 60 (*)    All other components within normal limits  CBC - Abnormal; Notable for the following components:   RDW 16.0 (*)    All other components within normal limits  TROPONIN I (HIGH SENSITIVITY)  TROPONIN I (HIGH SENSITIVITY)    EKG EKG Interpretation Date/Time:  Monday July 03 2023 14:58:11 EDT Ventricular Rate:  66 PR Interval:  148 QRS Duration:  82 QT Interval:  390 QTC Calculation: 408 R  Axis:   80  Text Interpretation: Normal sinus rhythm T wave inversions In inferior leadsnow more pronounced compared to previous ECG of 11/12/2022 Confirmed by Estelle June [id number]) on 07/03/2023 4:46:59 PM  Radiology DG Chest 2 View  Result Date: 07/03/2023 CLINICAL DATA:  Chest pain, hypertension EXAM: CHEST - 2 VIEW COMPARISON:  07/17/2011 FINDINGS: The heart size and mediastinal contours are within normal limits. No focal airspace consolidation, pleural effusion, or pneumothorax. The visualized skeletal structures are unremarkable. IMPRESSION: No active cardiopulmonary disease. Electronically Signed   By: Duanne Guess D.O.   On: 07/03/2023 16:31    Procedures Procedures    Medications Ordered in ED Medications  sodium chloride 0.9 % bolus 1,000 mL (1,000 mLs Intravenous New Bag/Given 07/03/23 1932)  sodium chloride 0.9 % bolus 1,000 mL (1,000 mLs Intravenous New Bag/Given 07/03/23 1929)  ondansetron (ZOFRAN) injection 4 mg (4 mg Intravenous Given 07/03/23 1932)  amLODipine (NORVASC) tablet 5 mg (5 mg Oral Given 07/03/23 2125)    ED Course/ Medical Decision Making/ A&P Clinical Course as of 07/03/23 2129  Mon Jul 03, 2023  1910 High-sensitivity troponin of 11.  Low suspicion for ACS at this time.  Laboratory workup notable for increasing creatinine but not AKI by definition.  Will provide IV fluids for rehydration and Zofran for nausea.  Will reevaluate [MP]  2118 Patient has received IV fluids here in the ED.  Nausea has improved.  Resting comfortably.  Will provide first dose of amlodipine here in the ED and discharged with prescription.  He understands the importance of following with his primary care doctor.  Return precautions were discussed in detail [MP]    Clinical Course User Index [MP] Royanne Foots, DO                                 Medical Decision Making 47 year old male with history as above presenting given concern for hypertension at dentist office.  He was noted  to be hypertensive with systolic blood pressure in the 180s at dentist office and was and was sent here for further evaluation.  Initial systolic blood pressure in the 150s here.  He does note recent chest tightness.  Will evaluate for ACS with high sensitive troponin, EKG and chest x-ray.  Will also look for any evidence of endorgan damage that would be consistent with hypertensive emergency/urgency with CBC and metabolic panel.  Will continue to monitor on cardiac monitor.  Suspect patient will be stable for discharge with plan to initiate antihypertensive and follow-up with his PCP.  Amount and/or Complexity of Data Reviewed Labs: ordered. Radiology: ordered.  Risk Prescription drug management.  Final Clinical Impression(s) / ED Diagnoses Final diagnoses:  Secondary hypertension    Rx / DC Orders ED Discharge Orders          Ordered    amLODipine (NORVASC) 5 MG tablet  Daily        07/03/23 2120              Royanne Foots, DO 07/03/23 1912    Royanne Foots, DO 07/03/23 2129

## 2023-07-03 NOTE — Discharge Instructions (Signed)
You were seen in the emergency ferment for high blood pressure Your kidney function was elevated we gave you IV fluids for this reason We gave you your first dose of blood pressure medication here in the ED You will need to pick up the rest from your pharmacy and begin taking this medication as directed every day Is important that you follow-up with your primary care doctor to recheck your blood pressure as prolonged high blood pressure can lead to serious problems such as heart attack and stroke Return to the emergency department for chest pain, trouble breathing or any other concerns

## 2023-07-03 NOTE — ED Triage Notes (Signed)
States he was at the dentist and was not able to get his procedure due to high blood pressure. States he has prescription for high blood pressure but has not started it yet. States "my fingers are numb and my head is cloudy" x 1 week. Patient added chest is tight also.

## 2023-07-04 ENCOUNTER — Other Ambulatory Visit (HOSPITAL_BASED_OUTPATIENT_CLINIC_OR_DEPARTMENT_OTHER): Payer: Self-pay

## 2023-07-04 ENCOUNTER — Other Ambulatory Visit: Payer: Self-pay
